# Patient Record
Sex: Male | Born: 1980 | Race: White | Hispanic: No | Marital: Single | Smoking: Current every day smoker
Health system: Southern US, Community
[De-identification: ages and names within clinical notes are randomized; demographics above are authoritative.]

## PROBLEM LIST (undated history)

## (undated) DIAGNOSIS — F1910 Other psychoactive substance abuse, uncomplicated: Secondary | ICD-10-CM

## (undated) HISTORY — PX: FRACTURE SURGERY: SHX138

---

## 2004-05-19 ENCOUNTER — Emergency Department (HOSPITAL_COMMUNITY): Admission: EM | Admit: 2004-05-19 | Discharge: 2004-05-19 | Payer: Self-pay | Admitting: *Deleted

## 2004-07-07 ENCOUNTER — Emergency Department (HOSPITAL_COMMUNITY): Admission: EM | Admit: 2004-07-07 | Discharge: 2004-07-07 | Payer: Self-pay | Admitting: Emergency Medicine

## 2005-12-05 ENCOUNTER — Emergency Department: Payer: Self-pay | Admitting: Emergency Medicine

## 2006-03-30 ENCOUNTER — Emergency Department: Payer: Self-pay | Admitting: Emergency Medicine

## 2009-04-18 ENCOUNTER — Emergency Department: Payer: Self-pay | Admitting: Emergency Medicine

## 2009-06-20 ENCOUNTER — Emergency Department (HOSPITAL_COMMUNITY): Admission: EM | Admit: 2009-06-20 | Discharge: 2009-06-20 | Payer: Self-pay | Admitting: Emergency Medicine

## 2010-04-14 ENCOUNTER — Emergency Department: Payer: Self-pay | Admitting: Emergency Medicine

## 2010-04-19 ENCOUNTER — Emergency Department: Payer: Self-pay | Admitting: Emergency Medicine

## 2011-02-08 LAB — URINALYSIS, ROUTINE W REFLEX MICROSCOPIC
Glucose, UA: NEGATIVE mg/dL
Hgb urine dipstick: NEGATIVE
Ketones, ur: NEGATIVE mg/dL
Protein, ur: NEGATIVE mg/dL
pH: 8 (ref 5.0–8.0)

## 2011-02-08 LAB — COMPREHENSIVE METABOLIC PANEL
ALT: 14 U/L (ref 0–53)
AST: 29 U/L (ref 0–37)
BUN: 11 mg/dL (ref 6–23)
CO2: 25 mEq/L (ref 19–32)
Calcium: 9.4 mg/dL (ref 8.4–10.5)
GFR calc Af Amer: >60 mL/min (ref >60)
Glucose, Bld: 101 mg/dL — ABNORMAL HIGH (ref 70–99)
Total Bilirubin: 1.8 mg/dL — ABNORMAL HIGH (ref 0.3–1.2)
Total Protein: 7 g/dL (ref 6.0–8.3)

## 2011-02-08 LAB — CBC
MCV: 96.1 fL (ref 78.0–100.0)
Platelets: 197 10*3/uL (ref 150–400)
RBC: 5.33 MIL/uL (ref 4.22–5.81)
RDW: 13.6 % (ref 11.5–15.5)

## 2011-02-08 LAB — DIFFERENTIAL
Eosinophils Absolute: 0.1 10*3/uL (ref 0.0–0.7)
Lymphs Abs: 1.2 10*3/uL (ref 0.7–4.0)
Monocytes Relative: 8 % (ref 3–12)
Neutro Abs: 5.2 10*3/uL (ref 1.7–7.7)

## 2011-02-08 LAB — HEMOCCULT GUIAC POC 1CARD (OFFICE): Fecal Occult Bld: NEGATIVE

## 2011-02-08 LAB — LIPASE, BLOOD: Lipase: 29 U/L (ref 11–59)

## 2012-10-18 DIAGNOSIS — F1021 Alcohol dependence, in remission: Secondary | ICD-10-CM | POA: Insufficient documentation

## 2012-10-18 DIAGNOSIS — F1910 Other psychoactive substance abuse, uncomplicated: Secondary | ICD-10-CM | POA: Insufficient documentation

## 2012-10-18 DIAGNOSIS — F17200 Nicotine dependence, unspecified, uncomplicated: Secondary | ICD-10-CM | POA: Insufficient documentation

## 2012-10-18 DIAGNOSIS — R112 Nausea with vomiting, unspecified: Secondary | ICD-10-CM | POA: Insufficient documentation

## 2012-10-19 ENCOUNTER — Emergency Department (HOSPITAL_COMMUNITY)
Admission: EM | Admit: 2012-10-19 | Discharge: 2012-10-19 | Disposition: A | Discharge status: Home / Self Care | Payer: Self-pay | Attending: Emergency Medicine | Admitting: Emergency Medicine

## 2012-10-19 ENCOUNTER — Encounter (HOSPITAL_COMMUNITY): Payer: Self-pay | Admitting: Emergency Medicine

## 2012-10-19 DIAGNOSIS — F1910 Other psychoactive substance abuse, uncomplicated: Secondary | ICD-10-CM

## 2012-10-19 LAB — CBC
HCT: 49.6 % (ref 39.0–52.0)
MCHC: 36.1 g/dL — ABNORMAL HIGH (ref 30.0–36.0)
Platelets: 247 10*3/uL (ref 150–400)
RBC: 5.39 MIL/uL (ref 4.22–5.81)
RDW: 12.9 % (ref 11.5–15.5)
WBC: 12.4 10*3/uL — ABNORMAL HIGH (ref 4.0–10.5)

## 2012-10-19 LAB — RAPID URINE DRUG SCREEN, HOSP PERFORMED
Barbiturates: NOT DETECTED
Benzodiazepines: NOT DETECTED
Cocaine: NOT DETECTED

## 2012-10-19 LAB — COMPREHENSIVE METABOLIC PANEL
AST: 22 U/L (ref 0–37)
Albumin: 3.8 g/dL (ref 3.5–5.2)
BUN: 5 mg/dL — ABNORMAL LOW (ref 6–23)
CO2: 26 mEq/L (ref 19–32)
Creatinine, Ser: 0.67 mg/dL (ref 0.50–1.35)
Glucose, Bld: 89 mg/dL (ref 70–99)
Sodium: 135 mEq/L (ref 135–145)

## 2012-10-19 LAB — ETHANOL: Alcohol, Ethyl (B): 301 mg/dL — ABNORMAL HIGH (ref 0–11)

## 2012-10-19 LAB — SALICYLATE LEVEL: Salicylate Lvl: <2 mg/dL — ABNORMAL LOW (ref 2.8–20.0)

## 2012-10-19 MED ORDER — NICOTINE 21 MG/24HR TD PT24
21.0000 mg | MEDICATED_PATCH | Freq: Every day | TRANSDERMAL | Status: DC
  Filled 2012-10-19: qty 1

## 2012-10-19 MED ORDER — THIAMINE HCL 100 MG/ML IJ SOLN: 100.0000 mg | Freq: Every day | INTRAMUSCULAR | Status: DC

## 2012-10-19 MED ORDER — ONDANSETRON 4 MG PO TBDP: 4.0000 mg | ORAL_TABLET | Freq: Four times a day (QID) | ORAL | Status: DC | PRN

## 2012-10-19 MED ORDER — METHOCARBAMOL 500 MG PO TABS: 500.0000 mg | ORAL_TABLET | Freq: Three times a day (TID) | ORAL | Status: DC | PRN

## 2012-10-19 MED ORDER — VITAMIN B-1 100 MG PO TABS
100.0000 mg | ORAL_TABLET | Freq: Every day | ORAL | Status: DC
  Administered 2012-10-19: 100 mg via ORAL
  Filled 2012-10-19: qty 1

## 2012-10-19 MED ORDER — NAPROXEN 500 MG PO TABS: 500.0000 mg | ORAL_TABLET | Freq: Two times a day (BID) | ORAL | Status: DC | PRN

## 2012-10-19 MED ORDER — HYDROXYZINE HCL 25 MG PO TABS: 25.0000 mg | ORAL_TABLET | Freq: Four times a day (QID) | ORAL | Status: DC | PRN

## 2012-10-19 MED ORDER — LORAZEPAM 1 MG PO TABS: 1.0000 mg | ORAL_TABLET | Freq: Four times a day (QID) | ORAL | Status: DC | PRN

## 2012-10-19 MED ORDER — ADULT MULTIVITAMIN W/MINERALS CH
1.0000 | ORAL_TABLET | Freq: Every day | ORAL | Status: DC
  Administered 2012-10-19: 1 via ORAL
  Filled 2012-10-19: qty 1

## 2012-10-19 MED ORDER — ONDANSETRON 8 MG PO TBDP
8.0000 mg | ORAL_TABLET | Freq: Once | ORAL | Status: AC
Start: 2012-10-19 — End: 2012-10-19
  Administered 2012-10-19: 8 mg via ORAL
  Filled 2012-10-19 (×2): qty 1

## 2012-10-19 MED ORDER — FOLIC ACID 1 MG PO TABS
1.0000 mg | ORAL_TABLET | Freq: Every day | ORAL | Status: DC
  Administered 2012-10-19: 1 mg via ORAL
  Filled 2012-10-19: qty 1

## 2012-10-19 MED ORDER — CLONIDINE HCL 0.2 MG PO TABS: 0.1000 mg | ORAL_TABLET | Freq: Two times a day (BID) | ORAL | Status: DC

## 2012-10-19 MED ORDER — POTASSIUM CHLORIDE 20 MEQ/15ML (10%) PO LIQD
10.0000 meq | Freq: Once | ORAL | Status: AC
  Administered 2012-10-19: 10 meq via ORAL
  Filled 2012-10-19: qty 15

## 2012-10-19 MED ORDER — LORAZEPAM 2 MG/ML IJ SOLN: 1.0000 mg | Freq: Four times a day (QID) | INTRAMUSCULAR | Status: DC | PRN

## 2012-10-19 MED ORDER — ZOLPIDEM TARTRATE 5 MG PO TABS: 5.0000 mg | ORAL_TABLET | Freq: Every evening | ORAL | Status: DC | PRN

## 2012-10-19 MED ORDER — LOPERAMIDE HCL 2 MG PO CAPS: 2.0000 mg | ORAL_CAPSULE | ORAL | Status: DC | PRN

## 2012-10-19 MED ORDER — ALUM & MAG HYDROXIDE-SIMETH 200-200-20 MG/5ML PO SUSP: 30.0000 mL | ORAL | Status: DC | PRN

## 2012-10-19 MED ORDER — DICYCLOMINE HCL 20 MG PO TABS: 20.0000 mg | ORAL_TABLET | Freq: Four times a day (QID) | ORAL | Status: DC | PRN

## 2012-10-19 MED ORDER — ONDANSETRON HCL 4 MG PO TABS: 4.0000 mg | ORAL_TABLET | Freq: Three times a day (TID) | ORAL | Status: DC | PRN

## 2012-10-19 MED ORDER — ACETAMINOPHEN 325 MG PO TABS: 650.0000 mg | ORAL_TABLET | ORAL | Status: DC | PRN

## 2012-10-19 MED ORDER — IBUPROFEN 600 MG PO TABS: 600.0000 mg | ORAL_TABLET | Freq: Three times a day (TID) | ORAL | Status: DC | PRN

## 2012-10-19 NOTE — ED Provider Notes (Addendum)
History     CSN: [id number]  Arrival date & time 10/18/12  2357   First MD Initiated Contact with Patient 10/19/12 0044      Chief Complaint  Patient presents with  . Addiction Problem    (Consider location/radiation/quality/duration/timing/severity/associated sxs/prior treatment) HPI History provided by patient and his father bedside. Patient has history of alcohol abuse and polysubstance abuse. Most recently has been on a drinking binge and using heroin. He presents here requesting detox. Last drink alcohol use prior to arrival. Last used heroin yesterday. He denies any other recent drug abuse. No suicidal or homicidal ideation. No self injury or trauma. Tonight has nausea vomiting. Symptoms moderate in severity History reviewed. No pertinent past medical history.  History reviewed. No pertinent past surgical history.  No family history on file.  History  Substance Use Topics  . Smoking status: Current Every Day Smoker -- 1.0 packs/day    Types: Cigarettes  . Smokeless tobacco: Not on file  . Alcohol Use: Yes      Review of Systems  Constitutional: Negative for fever and chills.  HENT: Negative for neck pain and neck stiffness.   Eyes: Negative for pain.  Respiratory: Negative for shortness of breath.   Cardiovascular: Negative for chest pain.  Gastrointestinal: Positive for nausea and vomiting. Negative for abdominal pain.  Genitourinary: Negative for dysuria.  Musculoskeletal: Negative for back pain.  Skin: Negative for rash.  Neurological: Negative for headaches.  All other systems reviewed and are negative.    Allergies  Review of patient's allergies indicates no known allergies.  Home Medications  No current outpatient prescriptions on file.  BP 141/112  Pulse 100  Temp 97.9 F (36.6 C) (Oral)  Resp 20  SpO2 100%  Physical Exam  Constitutional: He is oriented to person, place, and time. He appears well-developed and well-nourished.  HENT:  Head:  Normocephalic and atraumatic.  Eyes: Conjunctivae normal and EOM are normal. Pupils are equal, round, and reactive to light.  Neck: Trachea normal. Neck supple. No thyromegaly present.  Cardiovascular: Normal rate, regular rhythm, S1 normal, S2 normal and normal pulses.     No systolic murmur is present   No diastolic murmur is present  Pulses:      Radial pulses are 2+ on the right side, and 2+ on the left side.  Pulmonary/Chest: Effort normal and breath sounds normal. He has no wheezes. He has no rhonchi. He has no rales. He exhibits no tenderness.  Abdominal: Soft. Normal appearance and bowel sounds are normal. There is no tenderness. There is no CVA tenderness and negative Murphy's sign.  Musculoskeletal: He exhibits no edema and no tenderness.  Neurological: He is alert and oriented to person, place, and time. He has normal strength. No cranial nerve deficit or sensory deficit. GCS eye subscore is 4. GCS verbal subscore is 5. GCS motor subscore is 6.  Skin: Skin is warm and dry. No rash noted. He is not diaphoretic.  Psychiatric: His speech is normal.       Cooperative and appropriate    ED Course  Procedures (including critical care time)  Results for orders placed during the hospital encounter of 10/19/12  ACETAMINOPHEN LEVEL      Component Value Range   Acetaminophen (Tylenol), Serum <15.0  10 - 30 ug/mL  CBC      Component Value Range   WBC 12.4 (*) 4.0 - 10.5 K/uL   RBC 5.39  4.22 - 5.81 MIL/uL   Hemoglobin 17.9 (*) 13.0 -  17.0 g/dL   HCT 16.1  09.6 - 04.5 %   MCV 92.0  78.0 - 100.0 fL   MCH 33.2  26.0 - 34.0 pg   MCHC 36.1 (*) 30.0 - 36.0 g/dL   RDW 40.9  81.1 - 91.4 %   Platelets 247  150 - 400 K/uL  COMPREHENSIVE METABOLIC PANEL      Component Value Range   Sodium 135  135 - 145 mEq/L   Potassium 3.4 (*) 3.5 - 5.1 mEq/L   Chloride 96  96 - 112 mEq/L   CO2 26  19 - 32 mEq/L   Glucose, Bld 89  70 - 99 mg/dL   BUN 5 (*) 6 - 23 mg/dL   Creatinine, Ser 7.82  0.50 -  1.35 mg/dL   Calcium 9.1  8.4 - 95.6 mg/dL   Total Protein 7.8  6.0 - 8.3 g/dL   Albumin 3.8  3.5 - 5.2 g/dL   AST 22  0 - 37 U/L   ALT 16  0 - 53 U/L   Alkaline Phosphatase 69  39 - 117 U/L   Total Bilirubin 0.4  0.3 - 1.2 mg/dL   GFR calc non Af Amer >90  >90 mL/min   GFR calc Af Amer >90  >90 mL/min  ETHANOL      Component Value Range   Alcohol, Ethyl (B) 301 (*) 0 - 11 mg/dL  SALICYLATE LEVEL      Component Value Range   Salicylate Lvl <2.0 (*) 2.8 - 20.0 mg/dL  URINE RAPID DRUG SCREEN (HOSP PERFORMED)      Component Value Range   Opiates NONE DETECTED  NONE DETECTED   Cocaine NONE DETECTED  NONE DETECTED   Benzodiazepines NONE DETECTED  NONE DETECTED   Amphetamines NONE DETECTED  NONE DETECTED   Tetrahydrocannabinol POSITIVE (*) NONE DETECTED   Barbiturates NONE DETECTED  NONE DETECTED    1:09 AM ACT consulted  Alcohol and clonidine protocol initiated  MDM   Polysubstance abuse, requesting detox. Labs and UA reviewed as above. ACT involved to attempt placement. No indication for IVC.         Sunnie Nielsen, MD 10/19/12 0123  Patient moved to the psych ED pending possible detox 6:45 AM now stating he no longer wants detox wants to leave. Outpatient referrals provided. No SI or HI  Sunnie Nielsen, MD 10/19/12 [phone]

## 2012-10-19 NOTE — ED Notes (Signed)
Pt's dad will take pts belongings home

## 2012-10-19 NOTE — ED Notes (Signed)
Pt. Attempted to call his father for transportation home.  Father not asnwering his phone, pt. Upset because he wants to go home and can't find a ride.  Introduced myself to pt and explained that he can attempt to reach father again and try to rest until then.  Pt. Walked back to his room.

## 2012-10-19 NOTE — BH Assessment (Signed)
Assessment Note   Perry Kane is a 31 y.o. male who presents for detox. Pt denies SI/HI/Psych.  Pt reports the following: Pt is using alcohol(6pk up to 1/5 daily, last use was 10/18/12), heroin(4-5 bags daily, last use 10/18/12).  Pt also uses pain pills, Opana when heroin not avail, last use was 10/18/12.  Pt says he used 20mg  of Opana. 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  Pt admits to shooting in his right arm.  Pt told this Clinical research associate that he has been drinking heavily x1 month because of issues with relationships and financial problems.  Pt says--"I'm a fucking alcoholic and I'm stupid".  Pt says he's tired of living this way.  During assessment, pt was mostly uncooperative and unwilling to answer questions.  Pt.'s father was at bedside and encouraged him to be honest in order to receive help with SA.  Pt later requested d/c'd, decided against detox. Per dr. Dierdre Highman, doesn't want the pt to leave b/c stayed at bedside all night, wanted nurse to contact father to see if he was going to return.  Per dr. Dierdre Highman if father returns then pt can't leave, if father decides not to come back to hospital then pt can leave.   Axis I: Polysub Dep  Axis II: Deferred Axis III: History reviewed. No pertinent past medical history. Axis IV: economic problems, other psychosocial or environmental problems, problems related to social environment and problems with primary support group Axis V: 51-60 moderate symptoms  Past Medical History: History reviewed. No pertinent past medical history.  History reviewed. No pertinent past surgical history.  Family History: No family history on file.  Social History:  reports that he has been smoking Cigarettes.  He has been smoking about 1 pack per day. He does not have any smokeless tobacco history on file. He reports that he drinks alcohol. He reports that he uses illicit drugs (Heroin, Marijuana, and Opium).  Additional Social  History:  Alcohol / Drug Use Pain Medications: None  Prescriptions: None  Over the Counter: None  History of alcohol / drug use?: Yes Longest period of sobriety (when/how long): None  Negative Consequences of Use: Personal relationships;Work / Programmer, multimedia Withdrawal Symptoms: Other (Comment) (No current w/d sxs) Substance #1 Name of Substance 1: Alcohol 1 - Age of First Use: 10 YOM  1 - Amount (size/oz): 6pk up to 1/5  1 - Frequency: Daily  1 - Duration: On-going  1 - Last Use / Amount: 10/18/12 Substance #2 Name of Substance 2: Heroin--Sometimes uses Pain Pills(used Opana 10/18/12, 20mg ) 2 - Age of First Use: 31 YOM  2 - Amount (size/oz): 4-5 Bags  2 - Frequency: Daily  2 - Duration: On-going  2 - Last Use / Amount: 10/18/12 Substance #3 Name of Substance 3: THC  3 - Age of First Use: Teens  3 - Amount (size/oz): Unk  3 - Frequency: Unk  3 - Duration: Unk  3 - Last Use / Amount: 10/18/12  CIWA: CIWA-Ar BP: 137/92 mmHg Pulse Rate: 83  Nausea and Vomiting: no nausea and no vomiting Tactile Disturbances: none Tremor: no tremor Auditory Disturbances: not present Paroxysmal Sweats: no sweat visible Visual Disturbances: not present Anxiety: no anxiety, at ease Headache, Fullness in Head: none present Agitation: normal activity Orientation and Clouding of Sensorium: oriented and can do serial additions CIWA-Ar Total: 0  COWS: Clinical Opiate Withdrawal Scale (COWS) Resting Pulse Rate: Pulse Rate 81-100 Sweating: No  report of chills or flushing Restlessness: Able to sit still Pupil Size: Pupils pinned or normal size for room light Bone or Joint Aches: Not present Runny Nose or Tearing: Not present GI Upset: No GI symptoms Tremor: No tremor Yawning: No yawning Anxiety or Irritability: None Gooseflesh Skin: Skin is smooth COWS Total Score: 1   Allergies: No Known Allergies  Home Medications:  (Not in a hospital admission)  OB/GYN Status:  No LMP for male  patient.  General Assessment Data Location of Assessment: [facility] Living Arrangements: Alone Can pt return to current living arrangement?: Yes Admission Status: Voluntary Is patient capable of signing voluntary admission?: Yes Transfer from: Acute Hospital Referral Source: MD  Education Status Is patient currently in school?: No Current Grade: None  Highest grade of school patient has completed: None  Name of school: None  Contact person: None   Risk to self Suicidal Ideation: No Suicidal Intent: No Is patient at risk for suicide?: No Suicidal Plan?: No Access to Means: No What has been your use of drugs/alcohol within the last 12 months?: Abusing: Alcohol, Heroin, Pain Pills  Previous Attempts/Gestures: No How many times?: 0  Other Self Harm Risks: None  Triggers for Past Attempts: None known Intentional Self Injurious Behavior: None Family Suicide History: No Recent stressful life event(s): Financial Problems;Other (Comment) (Relational Issues ) Persecutory voices/beliefs?: No Depression: No Depression Symptoms:  (None Reported) Substance abuse history and/or treatment for substance abuse?: Yes Suicide prevention information given to non-admitted patients: Not applicable  Risk to Others Homicidal Ideation: No Thoughts of Harm to Others: No Current Homicidal Intent: No Current Homicidal Plan: No Access to Homicidal Means: No Identified Victim: None  History of harm to others?: No Assessment of Violence: None Noted Violent Behavior Description: None  Does patient have access to weapons?: No Criminal Charges Pending?: No Does patient have a court date: No  Psychosis Hallucinations: None noted Delusions: None noted  Mental Status Report Appear/Hygiene: Disheveled Eye Contact: Poor Motor Activity: Unremarkable Speech: Slurred;Logical/coherent Level of Consciousness: Drowsy Mood: Ambivalent Affect: Irritable Anxiety Level: None Thought Processes:  Coherent;Relevant Judgement: Unimpaired Orientation: Person;Place;Time;Situation Obsessive Compulsive Thoughts/Behaviors: None  Cognitive Functioning Concentration: Normal Memory: Recent Intact;Remote Intact IQ: Average Insight: Fair Impulse Control: Fair Appetite: Fair Weight Loss: 0  Weight Gain: 0  Sleep: No Change Total Hours of Sleep: 8  Vegetative Symptoms: None  ADLScreening [facility] Assessment Services) Patient's cognitive ability adequate to safely complete daily activities?: Yes Patient able to express need for assistance with ADLs?: Yes Independently performs ADLs?: Yes (appropriate for developmental age)  Abuse/Neglect [facility]) Physical Abuse: Denies Verbal Abuse: Denies Sexual Abuse: Denies  Prior Inpatient Therapy Prior Inpatient Therapy: No Prior Therapy Dates: None  Prior Therapy Facilty/Provider(s): None  Reason for Treatment: None   Prior Outpatient Therapy Prior Outpatient Therapy: No Prior Therapy Dates:  (None ) Prior Therapy Facilty/Provider(s): None  Reason for Treatment:  (None )  ADL Screening (condition at time of admission) Patient's cognitive ability adequate to safely complete daily activities?: Yes Patient able to express need for assistance with ADLs?: Yes Independently performs ADLs?: Yes (appropriate for developmental age) Weakness of Legs: None Weakness of Arms/Hands: None  Home Assistive Devices/Equipment Home Assistive Devices/Equipment: None  Therapy Consults (therapy consults require a physician order) PT Evaluation Needed: No OT Evalulation Needed: No SLP Evaluation Needed: No Abuse/Neglect Assessment (Assessment to be complete while patient is alone) Physical Abuse: Denies Verbal Abuse: Denies Sexual Abuse: Denies Exploitation of patient/patient's resources: Denies Self-Neglect: Denies Values /  Beliefs Cultural Requests During Hospitalization: None Spiritual Requests During Hospitalization: None Consults Spiritual Care  Consult Needed: No Social Work Consult Needed: No Merchant navy officer (For Healthcare) Advance Directive: Patient does not have advance directive;Patient would not like information Pre-existing out of facility DNR order (yellow form or pink MOST form): No Nutrition Screen- [facility] Patient's home diet: Regular Have you recently lost weight without trying?: No Have you been eating poorly because of a decreased appetite?: No Malnutrition Screening Tool Score: 0         Disposition:     On Site Evaluation by:   Reviewed with Physician:     Murrell Redden 10/19/2012 6:30 AM

## 2012-10-19 NOTE — ED Notes (Signed)
Pt sts he wants detox from ETOH and heroine. Pt appears unsteady and intoxicated.pt denies SI ans HI.

## 2013-05-18 ENCOUNTER — Emergency Department: Payer: Self-pay | Admitting: Internal Medicine

## 2020-04-05 ENCOUNTER — Emergency Department
Admission: EM | Admit: 2020-04-05 | Discharge: 2020-04-05 | Disposition: A | Discharge status: Home / Self Care | Payer: Self-pay | Attending: Student in an Organized Health Care Education/Training Program | Admitting: Student in an Organized Health Care Education/Training Program

## 2020-04-05 ENCOUNTER — Other Ambulatory Visit: Payer: Self-pay

## 2020-04-05 DIAGNOSIS — Z91040 Latex allergy status: Secondary | ICD-10-CM | POA: Insufficient documentation

## 2020-04-05 DIAGNOSIS — Y9389 Activity, other specified: Secondary | ICD-10-CM | POA: Insufficient documentation

## 2020-04-05 DIAGNOSIS — T189XXA Foreign body of alimentary tract, part unspecified, initial encounter: Secondary | ICD-10-CM | POA: Insufficient documentation

## 2020-04-05 DIAGNOSIS — F1110 Opioid abuse, uncomplicated: Secondary | ICD-10-CM | POA: Insufficient documentation

## 2020-04-05 DIAGNOSIS — F1210 Cannabis abuse, uncomplicated: Secondary | ICD-10-CM | POA: Insufficient documentation

## 2020-04-05 DIAGNOSIS — X58XXXA Exposure to other specified factors, initial encounter: Secondary | ICD-10-CM | POA: Insufficient documentation

## 2020-04-05 DIAGNOSIS — Y929 Unspecified place or not applicable: Secondary | ICD-10-CM | POA: Insufficient documentation

## 2020-04-05 DIAGNOSIS — Y998 Other external cause status: Secondary | ICD-10-CM | POA: Insufficient documentation

## 2020-04-05 DIAGNOSIS — F17210 Nicotine dependence, cigarettes, uncomplicated: Secondary | ICD-10-CM | POA: Insufficient documentation

## 2020-04-05 NOTE — ED Provider Notes (Signed)
[facility] Emergency Department Provider Note    First MD Initiated Contact with Patient 04/05/20 1202     (approximate)  I have reviewed the triage vital signs and the nursing notes.   HISTORY  Chief Complaint Ingestion    HPI Perry Kane is a 39 y.o. male presents to the ER please custody due to reported ingesting a bag of unreported substance.  Patient denies any symptoms.  Does admit to using heroin for the past 24 hours.  Denies any nausea vomiting chest pain or shortness of breath.  Has not required any Narcan.  With officer outside of the room patient does admit to ingesting "small baggy with only a tiny bit of residue left."  This is not an intent to harm himself.  This occurred around 11:00.   History reviewed. No pertinent past medical history. No family history on file. History reviewed. No pertinent surgical history. There are no problems to display for this patient.     Prior to Admission medications   Medication Sig Start Date End Date Taking? Authorizing Provider  acetaminophen (TYLENOL) 500 MG tablet Take 1,000 mg by mouth every 6 (six) hours as needed. For pain or headache   Yes [provider]    Allergies Latex and Nickel    Social History Social History   Tobacco Use  . Smoking status: Current Every Day Smoker    Packs/day: 1.00    Types: Cigarettes  . Smokeless tobacco: Never Used  Substance Use Topics  . Alcohol use: Yes    Comment: 6pk-1/5 daily   . Drug use: Yes    Types: Heroin, Marijuana, Opium    Comment: heroine    Review of Systems Patient denies headaches, rhinorrhea, blurry vision, numbness, shortness of breath, chest pain, edema, cough, abdominal pain, nausea, vomiting, diarrhea, dysuria, fevers, rashes or hallucinations unless otherwise stated above in HPI. ____________________________________________   PHYSICAL EXAM:  VITAL SIGNS: Vitals:   04/05/20 1139 04/05/20 1549  BP: 131/89 (!)  131/97  Pulse: (!) 120 100  Resp: 17 16  Temp: 98.9 F (37.2 C) 98.5 F (36.9 C)  SpO2: 99% 99%    Constitutional: Alert and oriented.  Eyes: Conjunctivae are normal.  Head: Atraumatic. Nose: No congestion/rhinnorhea. Mouth/Throat: Mucous membranes are moist.   Neck: No stridor. Painless ROM.  Cardiovascular: Normal rate, regular rhythm. Grossly normal heart sounds.  Good peripheral circulation. Respiratory: Normal respiratory effort.  No retractions. Lungs CTAB. Gastrointestinal: Soft and nontender. No distention. No abdominal bruits. No CVA tenderness. Genitourinary:  Musculoskeletal: No lower extremity tenderness nor edema.  No joint effusions. Neurologic:  Normal speech and language. No gross focal neurologic deficits are appreciated. No facial droop Skin:  Skin is warm, dry and intact. No rash noted. Psychiatric: Mood and affect are normal. Speech and behavior are normal.  ____________________________________________   LABS (all labs ordered are listed, but only abnormal results are displayed)  No results found for this or any previous visit (from the past 24 hour(s)). ____________________________________________  EKG My review and personal interpretation at Time: 12:41   Indication: ingestion  Rate: 100  Rhythm: sinus Axis: normal Other: normal intervals, no stemi ____________________________________________  RADIOLOGY  I personally reviewed all radiographic images ordered to evaluate for the above acute complaints and reviewed radiology reports and findings.  These findings were personally discussed with the patient.  Please see medical record for radiology report.  ____________________________________________   PROCEDURES  Procedure(s) performed:  Procedures    Critical  Care performed: no ____________________________________________   INITIAL IMPRESSION / ASSESSMENT AND PLAN / ED COURSE  Pertinent labs & imaging results that were available during my  care of the patient were reviewed by me and considered in my medical decision making (see chart for details).   DDX: Overdose, toxic ingestion, foreign body ingestion  Perry Kane is a 39 y.o. who presents to the ED with symptoms as described above.  Patient well-appearing afebrile hemodynamically stable nontoxic.  He is declining any medical work-up does agree to EKG which does not show any evidence of dysrhythmia or prolonged intervals.  Discussed case with poison control who recommends observation for 6hours.  This seems like a reasonable plan.  He remained under police custody.  No indication for IVC at this time.  Clinical Course as of Apr 06 1739  Thu Apr 05, 2020  1737 Patient has been observed for 6 hours post ingestion remains clinically stable in no acute distress.  He is medically cleared for discharge to police custody   [PR]    Clinical Course User Index [PR] Merlyn Lot, MD    The patient was evaluated in Emergency Department today for the symptoms described in the history of present illness. He/she was evaluated in the context of the global COVID-19 pandemic, which necessitated consideration that the patient might be at risk for infection with the SARS-CoV-2 virus that causes COVID-19. Institutional protocols and algorithms that pertain to the evaluation of patients at risk for COVID-19 are in a state of rapid change based on information released by regulatory bodies including the CDC and federal and state organizations. These policies and algorithms were followed during the patient's care in the ED.  As part of my medical decision making, I reviewed the following data within the [place] notes reviewed and incorporated, Labs reviewed, notes from prior ED visits and [place] Controlled Substance Database   ____________________________________________   FINAL CLINICAL IMPRESSION(S) / ED DIAGNOSES  Final diagnoses:  Ingestion of foreign material,  initial encounter      NEW MEDICATIONS STARTED DURING THIS VISIT:  New Prescriptions   No medications on file     Note:  This document was prepared using Dragon voice recognition software and may include unintentional dictation errors.    Merlyn Lot, MD 04/05/20 1740

## 2020-04-05 NOTE — ED Notes (Signed)
Pt in via police custody. Pt states that he was out looking for a job and got pulled over. Pt states that the officer said he saw him put something in his mouth and swallow it but pt states he did not. Officer concerned it could be meth or heroin. Advised pt that this could be life threatening and offered to have officer step out of the room but pt states that he did not swallow anything. Pt denies concerns or symptoms.

## 2020-04-05 NOTE — ED Triage Notes (Signed)
Pt comes into the ED via Counsellor in custody, states witnessed the pt ingestion a "baggie" of unknown substance about PTA. Pt will not tell what he ingested.

## 2020-05-02 ENCOUNTER — Other Ambulatory Visit: Payer: Self-pay

## 2020-05-02 ENCOUNTER — Encounter (HOSPITAL_COMMUNITY): Payer: Self-pay

## 2020-05-02 DIAGNOSIS — F17210 Nicotine dependence, cigarettes, uncomplicated: Secondary | ICD-10-CM | POA: Insufficient documentation

## 2020-05-02 DIAGNOSIS — F1910 Other psychoactive substance abuse, uncomplicated: Secondary | ICD-10-CM | POA: Insufficient documentation

## 2020-05-02 DIAGNOSIS — M79662 Pain in left lower leg: Secondary | ICD-10-CM | POA: Insufficient documentation

## 2020-05-02 DIAGNOSIS — Z91040 Latex allergy status: Secondary | ICD-10-CM | POA: Insufficient documentation

## 2020-05-02 NOTE — ED Triage Notes (Signed)
Patient arrived stating his left calf started hurting about 4 days ago. Declines any falls or injury at this time. Patient ambulatory in triage. Reports taking tylenol with no relief.

## 2020-05-03 ENCOUNTER — Encounter (HOSPITAL_COMMUNITY): Payer: Self-pay

## 2020-05-03 ENCOUNTER — Emergency Department (HOSPITAL_COMMUNITY): Payer: Self-pay

## 2020-05-03 ENCOUNTER — Other Ambulatory Visit: Payer: Self-pay

## 2020-05-03 ENCOUNTER — Emergency Department (HOSPITAL_COMMUNITY)
Admission: EM | Admit: 2020-05-03 | Discharge: 2020-05-03 | Disposition: A | Discharge status: Home / Self Care | Payer: Self-pay | Attending: Emergency Medicine | Admitting: Emergency Medicine

## 2020-05-03 ENCOUNTER — Ambulatory Visit (HOSPITAL_COMMUNITY)
Admission: RE | Admit: 2020-05-03 | Discharge: 2020-05-03 | Disposition: A | Discharge status: Home / Self Care | Payer: Self-pay | Source: Ambulatory Visit | Attending: Emergency Medicine | Admitting: Emergency Medicine

## 2020-05-03 DIAGNOSIS — M79662 Pain in left lower leg: Secondary | ICD-10-CM | POA: Insufficient documentation

## 2020-05-03 DIAGNOSIS — Z7901 Long term (current) use of anticoagulants: Secondary | ICD-10-CM | POA: Insufficient documentation

## 2020-05-03 DIAGNOSIS — I82402 Acute embolism and thrombosis of unspecified deep veins of left lower extremity: Secondary | ICD-10-CM | POA: Insufficient documentation

## 2020-05-03 DIAGNOSIS — M79609 Pain in unspecified limb: Secondary | ICD-10-CM

## 2020-05-03 DIAGNOSIS — F17210 Nicotine dependence, cigarettes, uncomplicated: Secondary | ICD-10-CM | POA: Insufficient documentation

## 2020-05-03 DIAGNOSIS — Z91040 Latex allergy status: Secondary | ICD-10-CM | POA: Insufficient documentation

## 2020-05-03 LAB — BASIC METABOLIC PANEL
Anion gap: 8 (ref 5–15)
BUN: 17 mg/dL (ref 6–20)
CO2: 27 mmol/L (ref 22–32)
Calcium: 9 mg/dL (ref 8.9–10.3)
Chloride: 103 mmol/L (ref 98–111)
Creatinine, Ser: 0.81 mg/dL (ref 0.61–1.24)
GFR calc Af Amer: >60 mL/min (ref >60)
GFR calc non Af Amer: >60 mL/min (ref >60)
Glucose, Bld: 91 mg/dL (ref 70–99)
Potassium: 4.4 mmol/L (ref 3.5–5.1)
Sodium: 138 mmol/L (ref 135–145)

## 2020-05-03 LAB — CBC WITH DIFFERENTIAL/PLATELET
Abs Immature Granulocytes: 0.03 10*3/uL (ref 0.00–0.07)
Basophils Absolute: 0 10*3/uL (ref 0.0–0.1)
Basophils Relative: 0 %
Eosinophils Absolute: 0.1 10*3/uL (ref 0.0–0.5)
Eosinophils Relative: 1 %
HCT: 43.6 % (ref 39.0–52.0)
Hemoglobin: 14.5 g/dL (ref 13.0–17.0)
Immature Granulocytes: 0 %
Lymphocytes Relative: 14 %
Lymphs Abs: 1.4 10*3/uL (ref 0.7–4.0)
MCH: 31.3 pg (ref 26.0–34.0)
MCHC: 33.3 g/dL (ref 30.0–36.0)
MCV: 94 fL (ref 80.0–100.0)
Monocytes Absolute: 1 10*3/uL (ref 0.1–1.0)
Monocytes Relative: 10 %
Neutro Abs: 7.5 10*3/uL (ref 1.7–7.7)
Neutrophils Relative %: 75 %
Platelets: 293 10*3/uL (ref 150–400)
RBC: 4.64 MIL/uL (ref 4.22–5.81)
RDW: 13.2 % (ref 11.5–15.5)
WBC: 10 10*3/uL (ref 4.0–10.5)
nRBC: 0 % (ref 0.0–0.2)

## 2020-05-03 MED ORDER — SODIUM CHLORIDE (PF) 0.9 % IJ SOLN
INTRAMUSCULAR | Status: AC
  Filled 2020-05-03: qty 50

## 2020-05-03 MED ORDER — RIVAROXABAN 15 MG PO TABS
15.0000 mg | ORAL_TABLET | Freq: Once | ORAL | Status: AC
  Administered 2020-05-03: 15 mg via ORAL
  Filled 2020-05-03: qty 1

## 2020-05-03 MED ORDER — RIVAROXABAN (XARELTO) VTE STARTER PACK (15 & 20 MG): ORAL_TABLET | ORAL | 0 refills | Status: AC

## 2020-05-03 MED ORDER — IOHEXOL 350 MG/ML SOLN
100.0000 mL | Freq: Once | INTRAVENOUS | Status: AC | PRN
  Administered 2020-05-03: 100 mL via INTRAVENOUS

## 2020-05-03 MED ORDER — KETOROLAC TROMETHAMINE 30 MG/ML IJ SOLN
30.0000 mg | Freq: Once | INTRAMUSCULAR | Status: AC
  Administered 2020-05-03: 30 mg via INTRAMUSCULAR
  Filled 2020-05-03: qty 1

## 2020-05-03 MED ORDER — ACETAMINOPHEN 325 MG PO TABS: 650.0000 mg | ORAL_TABLET | Freq: Four times a day (QID) | ORAL | 0 refills | Status: AC | PRN

## 2020-05-03 MED ORDER — OXYCODONE-ACETAMINOPHEN 5-325 MG PO TABS
1.0000 | ORAL_TABLET | Freq: Once | ORAL | Status: AC
  Administered 2020-05-03: 1 via ORAL
  Filled 2020-05-03: qty 1

## 2020-05-03 MED ORDER — ENOXAPARIN SODIUM 120 MG/0.8ML ~~LOC~~ SOLN
110.0000 mg | Freq: Once | SUBCUTANEOUS | Status: AC
  Administered 2020-05-03: 110 mg via SUBCUTANEOUS
  Filled 2020-05-03: qty 0.74

## 2020-05-03 NOTE — ED Notes (Signed)
Pt verbalizes understanding of DC instructions. Pt belongings returned and is ambulatory out of ED.  

## 2020-05-03 NOTE — ED Notes (Signed)
Pt ambulatory to RR with one crutch. Sample cup provided.

## 2020-05-03 NOTE — Progress Notes (Signed)
Left lower extremity venous duplex completed. Refer to "CV Proc" under chart review to view preliminary results.  05/03/2020 11:49 AM Eula Fried., MHA, RVT, RDCS, RDMS

## 2020-05-03 NOTE — Discharge Instructions (Addendum)
Thank you for allowing me to care for you today in the Emergency Department.    IMPORTANT PATIENT INSTRUCTIONS:  You have been scheduled for an Outpatient Vascular Study at [facility].    Please go to the [facility] Department Registration Desk at 11 am tomorrow morning and tell them you are there for a vascular study.    Take 650 mg of Tylenol or 600 mg of ibuprofen with food every 6 hours for pain.  You can alternate between these 2 medications every 3 hours if your pain returns.  For instance, you can take Tylenol at noon, followed by a dose of ibuprofen at 3, followed by second dose of Tylenol and 6.  Continue to elevate your left leg so that your toes are at or above the level of your nose, keep the Ace wrap over your calf to provide compression and help with pain, and use the crutches as needed until you can weight-bear on your left foot without considerable pain.  You were given a dose of a blood thinner, Lovenox tonight in the ER since there is concern that your pain may be due to a blood clot in your leg.  You should return to the emergency department if you have a fall or injury before you are seen for your ultrasound in the morning.  You should also return to the emergency department if your leg gets red, very swollen and hard, if your toes turn blue, if you develop new numbness, or other new, concerning symptoms.

## 2020-05-03 NOTE — ED Notes (Addendum)
Pharmacy otw to do Xarelto education

## 2020-05-03 NOTE — ED Provider Notes (Signed)
[facility]-EMERGENCY DEPT Provider Note   CSN: [id number] Arrival date & time: 05/03/20  1154     History Chief Complaint  Patient presents with  . DVT    Perry Kane is a 39 y.o. male.  HPI   39 year old male with history of polysubstance use disorder, who presents to the emergency department today complaining of pain to the left calf. He was seen in the ED last night and ultrasound of the left lower extremity was ordered which showed DVT therefore patient checked into the ED. During his visit last night he had denied any shortness of breath however he is now telling me that he has been short of breath for the last 3 days. He denies any chest pain or pain with inspiration.  History reviewed. No pertinent past medical history.  There are no problems to display for this patient.   Past Surgical History:  Procedure Laterality Date  . FRACTURE SURGERY         Family History  Problem Relation Age of Onset  . Heart attack Mother   . Heart failure Father     Social History   Tobacco Use  . Smoking status: Current Every Day Smoker    Packs/day: 1.00    Types: Cigarettes  . Smokeless tobacco: Never Used  Vaping Use  . Vaping Use: Never used  Substance Use Topics  . Alcohol use: Yes  . Drug use: Yes    Types: Heroin, Marijuana, Opium    Comment: heroine    Home Medications Prior to Admission medications   Medication Sig Start Date End Date Taking? Authorizing Provider  acetaminophen (TYLENOL) 325 MG tablet Take 2 tablets (650 mg total) by mouth every 6 (six) hours as needed. Do not take more than 4000mg  of tylenol per day 05/03/20   Adean Milosevic S, PA-C  RIVAROXABAN (XARELTO) VTE STARTER PACK (15 & 20 MG TABLETS) Follow package directions: Take one 15mg  tablet by mouth twice a day. On day 22, switch to one 20mg  tablet once a day. Take with food. 05/03/20   Autym Siess S, PA-C    Allergies    Latex and Nickel  Review of Systems   Review  of Systems  Constitutional: Negative for fever.  HENT: Negative for ear pain and sore throat.   Eyes: Negative for visual disturbance.  Respiratory: Positive for shortness of breath.   Cardiovascular: Negative for chest pain.  Gastrointestinal: Negative for abdominal pain, constipation, diarrhea, nausea and vomiting.  Genitourinary: Negative for dysuria and hematuria.  Musculoskeletal: Negative for back pain.       Left leg pain  Skin: Negative for rash.  Neurological: Negative for headaches.  All other systems reviewed and are negative.   Physical Exam Updated Vital Signs BP 120/65   Pulse 92   Temp 98.6 F (37 C) (Oral)   Resp 18   Ht 5\' 4"  (1.626 m)   Wt 71.8 kg   SpO2 99%   BMI 27.15 kg/m   Physical Exam Vitals and nursing note reviewed.  Constitutional:      Appearance: He is well-developed.  HENT:     Head: Normocephalic and atraumatic.  Eyes:     Conjunctiva/sclera: Conjunctivae normal.  Cardiovascular:     Rate and Rhythm: Normal rate and regular rhythm.     Heart sounds: No murmur heard.   Pulmonary:     Effort: Pulmonary effort is normal. No respiratory distress.  Abdominal:     General: Bowel sounds  are normal.     Palpations: Abdomen is soft.     Tenderness: There is no abdominal tenderness. There is no guarding or rebound.  Musculoskeletal:        General: Tenderness present.     Cervical back: Neck supple.     Right lower leg: No edema.     Left lower leg: Edema present.  Skin:    General: Skin is warm and dry.  Neurological:     Mental Status: He is alert.     ED Results / Procedures / Treatments   Labs (all labs ordered are listed, but only abnormal results are displayed) Labs Reviewed  CBC WITH DIFFERENTIAL/PLATELET  BASIC METABOLIC PANEL    EKG None  Radiology CT Angio Chest PE W and/or Wo Contrast  Result Date: 05/03/2020 CLINICAL DATA:  DVT LEFT calf, LEFT leg pain, shortness of breath, question pulmonary embolism, high  clinical probability EXAM: CT ANGIOGRAPHY CHEST WITH CONTRAST TECHNIQUE: Multidetector CT imaging of the chest was performed using the standard protocol during bolus administration of intravenous contrast. Multiplanar CT image reconstructions and MIPs were obtained to evaluate the vascular anatomy. CONTRAST:  100mL OMNIPAQUE IOHEXOL 350 MG/ML SOLN IV COMPARISON:  None FINDINGS: Cardiovascular: Aorta normal caliber without aneurysm or dissection. Heart unremarkable. No pericardial effusion. Pulmonary arteries adequately opacified and patent. No evidence of pulmonary embolism. Mediastinum/Nodes: Esophagus normal appearance. No thoracic adenopathy. Base of cervical region unremarkable. Lungs/Pleura: Lungs clear. No pulmonary infiltrate, pleural effusion or pneumothorax. No definite pulmonary mass. Upper Abdomen: Visualized upper abdomen unremarkable Musculoskeletal: No acute osseous findings. Review of the MIP images confirms the above findings. IMPRESSION: Normal exam. No evidence of pulmonary embolism. No acute intrathoracic abnormalities. Electronically Signed   By: Ulyses SouthwardMark  Boles M.D.   On: 05/03/2020 14:36   LE VENOUS  Result Date: 05/03/2020  Lower Venous DVTStudy Indications: Left calf pain x 4 days.  Comparison Study: No prior study Performing Technologist: Gertie FeyMichelle Simonetti MHA, RDMS, RVT, RDCS  Examination Guidelines: A complete evaluation includes B-mode imaging, spectral Doppler, color Doppler, and power Doppler as needed of all accessible portions of each vessel. Bilateral testing is considered an integral part of a complete examination. Limited examinations for reoccurring indications may be performed as noted. The reflux portion of the exam is performed with the patient in reverse Trendelenburg.  +-----+---------------+---------+-----------+----------+--------------+ RIGHTCompressibilityPhasicitySpontaneityPropertiesThrombus Aging  +-----+---------------+---------+-----------+----------+--------------+ CFV  Full           Yes      Yes                                 +-----+---------------+---------+-----------+----------+--------------+   +---------+---------------+---------+-----------+----------+------------------+ LEFT     CompressibilityPhasicitySpontaneityPropertiesThrombus Aging     +---------+---------------+---------+-----------+----------+------------------+ CFV      Full           Yes      Yes                                     +---------+---------------+---------+-----------+----------+------------------+ SFJ      Full                                                            +---------+---------------+---------+-----------+----------+------------------+  FV Prox  Full                                                            +---------+---------------+---------+-----------+----------+------------------+ FV Mid   Full                                                            +---------+---------------+---------+-----------+----------+------------------+ FV DistalFull                                                            +---------+---------------+---------+-----------+----------+------------------+ PFV      Full                                                            +---------+---------------+---------+-----------+----------+------------------+ POP      Full           Yes      Yes                                     +---------+---------------+---------+-----------+----------+------------------+ PTV      None                    No                   Acute- single PTV                                                        only               +---------+---------------+---------+-----------+----------+------------------+ PERO     Full                                                             +---------+---------------+---------+-----------+----------+------------------+     Summary: LEFT: - Findings consistent with acute deep vein thrombosis involving a single left posterior tibial vein. - No cystic structure found in the popliteal fossa.  *See table(s) above for measurements and observations.    Preliminary     Procedures Procedures (including critical care time)  Medications Ordered in ED Medications  Rivaroxaban (XARELTO) tablet 15 mg (has no administration in time range)  iohexol (OMNIPAQUE) 350 MG/ML injection 100 mL (100 mLs Intravenous Contrast Given 05/03/20 1358)  sodium chloride (PF) 0.9 % injection (  Given  by Other 05/03/20 1414)    ED Course  I have reviewed the triage vital signs and the nursing notes.  Pertinent labs & imaging results that were available during my care of the patient were reviewed by me and considered in my medical decision making (see chart for details).    MDM Rules/Calculators/A&P                          39 year old male presenting for evaluation of left lower extremity pain. Had ultrasound this morning which showed DVT in the left leg. On my assessment he is now complaining of shortness of breath for the last 3 days. Will obtain CTA chest and laboratory work.  CBC nonacute  BMP nonacute  LLE [place] study positive for DVT  CTA chest Normal exam. No evidence of pulmonary embolism. No acute intrathoracic abnormalities.  Patient given first dose of Xarelto here in the ED.  Contacted pharmacy who will come see the patient to give starter pack and discuss anticoagulation therapy.  2:55 PM CONSULT with Raquita with social work who will help patient obtain a PCP appointment.   Patient given information to follow-up with PCP.  Advised on strict return precautions.  He voices understanding plan reasons to return. all Questions answered.  Patient stable for discharge.  Final Clinical Impression(s) / ED Diagnoses Final diagnoses:  Acute deep vein  thrombosis (DVT) of left lower extremity, unspecified vein (HCC)    Rx / DC Orders ED Discharge Orders         Ordered    RIVAROXABAN (XARELTO) VTE STARTER PACK (15 & 20 MG TABLETS)     Discontinue  Reprint     05/03/20 1454    acetaminophen (TYLENOL) 325 MG tablet  Every 6 hours PRN     Discontinue  Reprint     05/03/20 [address] Rajah Tagliaferro S, PA-C 05/03/20 1525    Arby Barrette, MD 05/04/20 1016

## 2020-05-03 NOTE — Discharge Instructions (Addendum)
Take Xarelto as directed.   Please follow up with your primary care provider within 5-7 days for re-evaluation of your symptoms. If you do not have a primary care provider, information for a healthcare clinic has been provided for you to make arrangements for follow up care. Please return to the emergency department for any new or worsening symptoms.  If you have any chest pain, pain with breathing, shortness of breath or any new or worsening symptoms you need to return to the emergency department immediately  Information on my medicine - XARELTO (rivaroxaban)  WHY WAS XARELTO PRESCRIBED FOR YOU? Xarelto was prescribed to treat blood clots that may have been found in the veins of your legs (deep vein thrombosis) or in your lungs (pulmonary embolism) and to reduce the risk of them occurring again.  What do you need to know about Xarelto? The starting dose is one 15 mg tablet taken TWICE daily with food for the FIRST 21 DAYS then the dose is changed to one 20 mg tablet taken ONCE A DAY with your evening meal.  DO NOT stop taking Xarelto without talking to the health care provider who prescribed the medication.  Refill your prescription for 20 mg tablets before you run out.  After discharge, you should have regular check-up appointments with your healthcare provider that is prescribing your Xarelto.  In the future your dose may need to be changed if your kidney function changes by a significant amount.  What do you do if you miss a dose? If you are taking Xarelto TWICE DAILY and you miss a dose, take it as soon as you remember. You may take two 15 mg tablets (total 30 mg) at the same time then resume your regularly scheduled 15 mg twice daily the next day.  If you are taking Xarelto ONCE DAILY and you miss a dose, take it as soon as you remember on the same day then continue your regularly scheduled once daily regimen the next day. Do not take two doses of Xarelto at the same time.    Important Safety Information Xarelto is a blood thinner medicine that can cause bleeding. You should call your healthcare provider right away if you experience any of the following: ? Bleeding from an injury or your nose that does not stop. ? Unusual colored urine (red or dark brown) or unusual colored stools (red or black). ? Unusual bruising for unknown reasons. ? A serious fall or if you hit your head (even if there is no bleeding).  Some medicines may interact with Xarelto and might increase your risk of bleeding while on Xarelto. To help avoid this, consult your healthcare provider or pharmacist prior to using any new prescription or non-prescription medications, including herbals, vitamins, non-steroidal anti-inflammatory drugs (NSAIDs) and supplements.  This website has more information on Xarelto: VisitDestination.com.br.

## 2020-05-03 NOTE — Progress Notes (Signed)
2nd shift ED CSW received a handoff from the 1st shift [facility] ED CSW.    CSW went to assist pt with a PCP appt but fter reviewing chart CSW sees pt has D/C'd.  CSW will continue to follow for D/C needs.  Dorothe Pea. Dianca Owensby  MSW, LCSW, LCAS, CCS Transitions of Care Clinical Social Worker Care Coordination Department Ph: [phone]

## 2020-05-03 NOTE — ED Triage Notes (Signed)
Patient had a vascular US today and was positive for a DVT in the left calf.

## 2020-05-03 NOTE — Social Work (Signed)
TOC CSW spoke with pt about PCP.  Pt does not currently have a PCP.  Pt does not have anyone in find at current moment.    2nd shift CSW will continue to follow pt.  Sherice Ijames Tarpley-Carter, MSW, LCSW-A [facility] ED Transitions of Care Clinical Social Worker [facility] [phone]

## 2020-05-03 NOTE — ED Provider Notes (Signed)
[facility]-EMERGENCY DEPT Provider Note   CSN: [id number] Arrival date & time: 05/02/20  2310     History Chief Complaint  Patient presents with  . Leg Pain    Perry Kane is a 39 y.o. male with a history of polysubstance use disorder, heroin use disorder who presents the emergency department with a chief complaint of left calf pain.  The patient reports gradual onset of cramping left calf pain that has been constant since onset, but waxing and waning and intensity. He denies recent falls, injury, fever, chills, numbness, weakness, left knee or ankle pain, shortness of breath, chest pain, back pain, paresthesias.  No redness or warmth to the site.  Pain is worse with ambulation and improved with nonweightbearing.  No recent surgery or immobilization.  No long travel.  No personal or familial history of VTE.  No history of similar.  He has been taking Tylenol without improvement.  He has also been applying an Ace wrap to the leg and elevating it with some improvement in his pain.  No new exercises or activities.  He does have history of IV drug use, but states that he has not used and months.  No recent tattoos.  The history is provided by the patient. No language interpreter was used.       History reviewed. No pertinent past medical history.  There are no problems to display for this patient.   History reviewed. No pertinent surgical history.     No family history on file.  Social History   Tobacco Use  . Smoking status: Current Every Day Smoker    Packs/day: 1.00    Types: Cigarettes  . Smokeless tobacco: Never Used  Substance Use Topics  . Alcohol use: Yes    Comment: 6pk-1/5 daily   . Drug use: Yes    Types: Heroin, Marijuana, Opium    Comment: heroine    Home Medications Prior to Admission medications   Medication Sig Start Date End Date Taking? Authorizing Provider  acetaminophen (TYLENOL) 500 MG tablet Take 1,000 mg by mouth every 6  (six) hours as needed. For pain or headache    [provider]    Allergies    Latex and Nickel  Review of Systems   Review of Systems  Constitutional: Negative for appetite change and fever.  Respiratory: Negative for shortness of breath.   Cardiovascular: Negative for chest pain.  Gastrointestinal: Negative for abdominal pain.  Genitourinary: Negative for dysuria.  Musculoskeletal: Positive for gait problem and myalgias. Negative for arthralgias, back pain, neck pain and neck stiffness.  Skin: Negative for rash and wound.  Allergic/Immunologic: Negative for immunocompromised state.  Neurological: Negative for seizures, syncope, weakness, numbness and headaches.  Psychiatric/Behavioral: Negative for confusion.    Physical Exam Updated Vital Signs BP (!) 140/113 (BP Location: Left Arm)   Pulse 79   Temp 98.6 F (37 C) (Oral)   Resp 18   Ht 5\' 4"  (1.626 m)   Wt 71.8 kg   SpO2 99%   BMI 27.15 kg/m   Physical Exam Vitals and nursing note reviewed.  Constitutional:      Appearance: He is well-developed. He is not ill-appearing, toxic-appearing or diaphoretic.  HENT:     Head: Normocephalic.  Eyes:     Conjunctiva/sclera: Conjunctivae normal.  Cardiovascular:     Rate and Rhythm: Normal rate and regular rhythm.     Heart sounds: No murmur heard.   Pulmonary:     Effort:  Pulmonary effort is normal. No respiratory distress.     Breath sounds: No stridor. No wheezing, rhonchi or rales.  Chest:     Chest wall: No tenderness.  Abdominal:     Palpations: Abdomen is soft.     Tenderness: There is no abdominal tenderness.  Musculoskeletal:        General: Tenderness present.     Cervical back: Neck supple.     Right lower leg: No edema.     Left lower leg: No edema.     Comments: Tenderness to palpation in the calf.  Muscular compartments are soft.  Multiple tattoos are noted to the left lower leg, but there is no erythema, warmth, induration, or fluctuance.  No  track marks noted to the lower extremity.  Normal exam of the left knee without focal tenderness to the medial or lateral joint line.  Full active and passive range of motion.  Normal exam of the left ankle.  Achilles tendon is intact.  Good capillary refill.  Decreased strength against resistance secondary to pain.  Sensation is intact throughout.  Skin:    General: Skin is warm and dry.  Neurological:     Mental Status: He is alert.  Psychiatric:        Behavior: Behavior normal.     ED Results / Procedures / Treatments   Labs (all labs ordered are listed, but only abnormal results are displayed) Labs Reviewed - No data to display  EKG None  Radiology No results found.  Procedures Procedures (including critical care time)  Medications Ordered in ED Medications  ketorolac (TORADOL) 30 MG/ML injection 30 mg (30 mg Intramuscular Given 05/03/20 0329)  enoxaparin (LOVENOX) injection 110 mg (110 mg Subcutaneous Given 05/03/20 0330)    ED Course  I have reviewed the triage vital signs and the nursing notes.  Pertinent labs & imaging results that were available during my care of the patient were reviewed by me and considered in my medical decision making (see chart for details).    MDM Rules/Calculators/A&P                          39 year old male with a history of polysubstance use disorder, heroin use disorder presenting with left calf pain for the last 4 days.  No other associated symptoms including shortness of breath, or chest pain.  No history of VTE.  On exam, there is no evidence of compartment syndrome, gout, or infectious process.  He does have a history of IV drug use, but is adamant that he has not used any months.  Notably, he had a visit to the ER approximately 1 month ago for heroin ingestion.  He has had no recent injuries to suspect musculoskeletal strain or tear.  His Achilles tendon is intact.  He is having no other cramping or spasms to suggest rhabdomyolysis.  No  shortness of breath or chest pain suggestive of PE.  He will need a venous duplex of the left lower extremity to assess for DVT.  Pain controlled in the ER he was given a dose of Lovenox.  Counseled on return precautions for anticoagulation.  Outpatient venous duplex study has been placed.  He will be given crutches and a brace to help with pain control.  All questions answered.  He is hemodynamically stable and in no acute distress.  Safe for discharge at this time to follow-up with outpatient venous duplex study.  Final Clinical Impression(s) / ED  Diagnoses Final diagnoses:  Pain in left lower leg    Rx / DC Orders ED Discharge Orders         Ordered    LE VENOUS        05/03/20 0303           Frederik Pear A, PA-C 05/03/20 0932    Shon Baton, MD 05/03/20 [phone]

## 2020-06-05 ENCOUNTER — Emergency Department (HOSPITAL_COMMUNITY): Payer: Self-pay

## 2020-06-05 ENCOUNTER — Other Ambulatory Visit: Payer: Self-pay

## 2020-06-05 ENCOUNTER — Encounter (HOSPITAL_COMMUNITY): Payer: Self-pay | Admitting: Emergency Medicine

## 2020-06-05 ENCOUNTER — Emergency Department (HOSPITAL_COMMUNITY)
Admission: EM | Admit: 2020-06-05 | Discharge: 2020-06-06 | Disposition: A | Discharge status: Home / Self Care | Payer: Self-pay | Attending: Emergency Medicine | Admitting: Emergency Medicine

## 2020-06-05 DIAGNOSIS — Z7901 Long term (current) use of anticoagulants: Secondary | ICD-10-CM | POA: Insufficient documentation

## 2020-06-05 DIAGNOSIS — R000 Tachycardia, unspecified: Secondary | ICD-10-CM | POA: Insufficient documentation

## 2020-06-05 DIAGNOSIS — F17210 Nicotine dependence, cigarettes, uncomplicated: Secondary | ICD-10-CM | POA: Insufficient documentation

## 2020-06-05 DIAGNOSIS — R4182 Altered mental status, unspecified: Secondary | ICD-10-CM | POA: Insufficient documentation

## 2020-06-05 DIAGNOSIS — R4189 Other symptoms and signs involving cognitive functions and awareness: Secondary | ICD-10-CM

## 2020-06-05 DIAGNOSIS — Z20822 Contact with and (suspected) exposure to covid-19: Secondary | ICD-10-CM | POA: Insufficient documentation

## 2020-06-05 DIAGNOSIS — Z91040 Latex allergy status: Secondary | ICD-10-CM | POA: Insufficient documentation

## 2020-06-05 DIAGNOSIS — T401X1A Poisoning by heroin, accidental (unintentional), initial encounter: Secondary | ICD-10-CM | POA: Insufficient documentation

## 2020-06-05 HISTORY — DX: Other psychoactive substance abuse, uncomplicated: F19.10

## 2020-06-05 LAB — CBC WITH DIFFERENTIAL/PLATELET
Abs Immature Granulocytes: 0.23 10*3/uL — ABNORMAL HIGH (ref 0.00–0.07)
Basophils Absolute: 0.1 10*3/uL (ref 0.0–0.1)
Basophils Relative: 0 %
Eosinophils Absolute: 0 10*3/uL (ref 0.0–0.5)
Eosinophils Relative: 0 %
HCT: 41 % (ref 39.0–52.0)
Hemoglobin: 13 g/dL (ref 13.0–17.0)
Immature Granulocytes: 1 %
Lymphocytes Relative: 5 %
Lymphs Abs: 1.1 10*3/uL (ref 0.7–4.0)
MCH: 30.5 pg (ref 26.0–34.0)
MCHC: 31.7 g/dL (ref 30.0–36.0)
MCV: 96.2 fL (ref 80.0–100.0)
Monocytes Absolute: 1.2 10*3/uL — ABNORMAL HIGH (ref 0.1–1.0)
Monocytes Relative: 5 %
Neutro Abs: 19.9 10*3/uL — ABNORMAL HIGH (ref 1.7–7.7)
Neutrophils Relative %: 89 %
Platelets: 277 10*3/uL (ref 150–400)
RBC: 4.26 MIL/uL (ref 4.22–5.81)
RDW: 12.5 % (ref 11.5–15.5)
WBC: 22.6 10*3/uL — ABNORMAL HIGH (ref 4.0–10.5)
nRBC: 0 % (ref 0.0–0.2)

## 2020-06-05 LAB — RAPID URINE DRUG SCREEN, HOSP PERFORMED
Amphetamines: POSITIVE — AB
Barbiturates: NOT DETECTED
Benzodiazepines: NOT DETECTED
Cocaine: NOT DETECTED
Opiates: NOT DETECTED
Tetrahydrocannabinol: POSITIVE — AB

## 2020-06-05 LAB — SARS CORONAVIRUS 2 BY RT PCR (HOSPITAL ORDER, PERFORMED IN ~~LOC~~ HOSPITAL LAB): SARS Coronavirus 2: NEGATIVE

## 2020-06-05 LAB — URINALYSIS, ROUTINE W REFLEX MICROSCOPIC
Bilirubin Urine: NEGATIVE
Glucose, UA: >=500 mg/dL — AB
Hgb urine dipstick: NEGATIVE
Ketones, ur: NEGATIVE mg/dL
Nitrite: NEGATIVE
Protein, ur: 30 mg/dL — AB
Specific Gravity, Urine: 1.017 (ref 1.005–1.030)
pH: 5 (ref 5.0–8.0)

## 2020-06-05 LAB — COMPREHENSIVE METABOLIC PANEL
ALT: 20 U/L (ref 0–44)
AST: 24 U/L (ref 15–41)
Albumin: 3.4 g/dL — ABNORMAL LOW (ref 3.5–5.0)
Alkaline Phosphatase: 61 U/L (ref 38–126)
Anion gap: 8 (ref 5–15)
BUN: 13 mg/dL (ref 6–20)
CO2: 27 mmol/L (ref 22–32)
Calcium: 8.2 mg/dL — ABNORMAL LOW (ref 8.9–10.3)
Chloride: 103 mmol/L (ref 98–111)
Creatinine, Ser: 1.03 mg/dL (ref 0.61–1.24)
GFR calc Af Amer: >60 mL/min (ref >60)
GFR calc non Af Amer: >60 mL/min (ref >60)
Glucose, Bld: 197 mg/dL — ABNORMAL HIGH (ref 70–99)
Potassium: 4.4 mmol/L (ref 3.5–5.1)
Sodium: 138 mmol/L (ref 135–145)
Total Bilirubin: 0.4 mg/dL (ref 0.3–1.2)
Total Protein: 7 g/dL (ref 6.5–8.1)

## 2020-06-05 LAB — ETHANOL: Alcohol, Ethyl (B): <10 mg/dL (ref <10)

## 2020-06-05 LAB — ACETAMINOPHEN LEVEL: Acetaminophen (Tylenol), Serum: <10 ug/mL — ABNORMAL LOW (ref 10–30)

## 2020-06-05 LAB — SALICYLATE LEVEL: Salicylate Lvl: <7 mg/dL — ABNORMAL LOW (ref 7.0–30.0)

## 2020-06-05 MED ORDER — SODIUM CHLORIDE 0.9 % IV BOLUS
1000.0000 mL | Freq: Once | INTRAVENOUS | Status: AC
  Administered 2020-06-05: 1000 mL via INTRAVENOUS

## 2020-06-05 MED ORDER — ONDANSETRON HCL 4 MG/2ML IJ SOLN
4.0000 mg | Freq: Once | INTRAMUSCULAR | Status: AC
  Administered 2020-06-05: 4 mg via INTRAVENOUS
  Filled 2020-06-05: qty 2

## 2020-06-05 MED ORDER — RIVAROXABAN (XARELTO) VTE STARTER PACK (15 & 20 MG): 20.0000 mg | ORAL_TABLET | Freq: Every day | ORAL | Status: DC

## 2020-06-05 MED ORDER — RIVAROXABAN 20 MG PO TABS
20.0000 mg | ORAL_TABLET | Freq: Every day | ORAL | Status: DC
  Administered 2020-06-05: 20 mg via ORAL
  Filled 2020-06-05: qty 1

## 2020-06-05 NOTE — ED Notes (Signed)
Unable to find CO2 end-tidal machine, none available in the department. PA notified.

## 2020-06-05 NOTE — ED Provider Notes (Addendum)
1600: Patient signed out to me by previous EDPA at shift change pending metabolization.  See previous note for full details.  Briefly, patient admitted to snorting heroin and smoking marijuana.  Denies suicide attempt.  Found on the ground by father at home.  Labs reviewed reveal leukocytosis 22.6, hyperglycemia 197.    1600: I messaged RN [place] and asked for patient to be put on end tidal but states she cannot find the machine.   Physical Exam  BP 118/79   Pulse 73   Temp 97.6 F (36.4 C) (Oral)   Resp 13   Ht 5\' 4"  (1.626 m)   Wt 72 kg   SpO2 94%   BMI 27.25 kg/m   Physical Exam Vitals and nursing note reviewed.  Constitutional:      General: He is not in acute distress.    Appearance: He is well-developed.     Comments: NAD.  HENT:     Head: Normocephalic.     Right Ear: External ear normal.     Left Ear: External ear normal.     Nose: Nose normal.     Mouth/Throat:     Mouth: Mucous membranes are dry.     Comments: Dry lips, sticking together. No intraoral tongue or lip injury or bleed Eyes:     General: No scleral icterus.    Conjunctiva/sclera: Conjunctivae normal.  Cardiovascular:     Rate and Rhythm: Normal rate and regular rhythm.     Heart sounds: Normal heart sounds.     Comments: No LE edema, calf tenderness  Pulmonary:     Effort: Pulmonary effort is normal.     Breath sounds: Normal breath sounds.  Abdominal:     Palpations: Abdomen is soft.     Tenderness: There is no abdominal tenderness.  Musculoskeletal:        General: Normal range of motion.     Cervical back: Normal range of motion and neck supple.  Skin:    General: Skin is warm and dry.     Capillary Refill: Capillary refill takes less than 2 seconds.  Neurological:     Mental Status: He is alert and oriented to person, place, and time.     Comments: Asleep in hall bed but easily arousable to voice.  Can answer appropriately intermittently falls asleep during conversation.  Chooses to  keep eyes closed during conversation. Oriented to full name, city, year, events.  Strength and sensation intact in upper/lower extremities.    Psychiatric:        Behavior: Behavior normal.        Thought Content: Thought content normal.        Judgment: Judgment normal.     Comments: Denies self harm attempt, SI, HI, hallucinations.      ED Course/Procedures   Clinical Course as of Jun 05 2028  Tue Jun 05, 2020  1359 EKG: Rate 101 Sinus tachycardia Normal axis Normal intervals Normal ST/T waves   [CS]  1846 Glucose, UA(!): >=500 [CG]  1846 Non acute   DG Chest 1 View [CG]  1847 negative  CT Head Wo Contrast [CG]  1847 WBC(!): 22.6 [CG]  1847 Temp: 97.6 F (36.4 C) [CG]  1847 Pulse Rate: 97 [CG]  1847 Resp: 20 [CG]  1847 SpO2: 94 % [CG]  1847 Amphetamines(!): POSITIVE [CG]  1847 Tetrahydrocannabinol(!): POSITIVE [CG]  1847 Leukocytes,Ua(!): TRACE [CG]  1847 WBC, UA: 21-50 [CG]  1847 Bacteria, UA(!): RARE [CG]    Clinical Course User  Index [CG] Liberty Handy, PA-C [CS] [name], MD    Procedures  MDM   [phone]: I have reviewed patient's chart.   Seen last month and diagnosed with DVT.  On Xarelto. Patient states he has been taking it but ran out 1 week ago. RLE swelling and calf pain resolved. Denies chest pain, SOB, hemoptysis.  No symptoms to suggest PE. VS normal. No tachychardia, tachypnea, hypoxia.   Given found on ground, I added head CT, EKG.    Leukocytosis WBC 22 with left shift.   Patient states he used heroin because he was having withdrawals, states he has been on suboxone last dose 4 days ago but since has had nausea, diarrhea for the last few days without abdominal pain.  Feels "dehydrated".  Denies fever, vomiting. Denies cough. Denies dysuria.  Leukocytosis may be from dehydration but given unreliable history I added CXR, UA, COVID swab.  1 L IVF ordered.  1850: CXR without infectious findings. UA as above, but again pt without  symptoms, suprapubic or CVAT.  Will send for culture and defer treatment for UTI.  COVID swab pending. However, no hypoxia or respiratory symptoms.    Patient is alert now, holding conversation.  Medically cleared.    Patient's father filled out IVC paperwork. Per IVC paperwork patient has been reporting hearing voices to father, non compliant with medication for blood clot, continues to use drugs, aggressive and threatening.  Patient denies these to me.  No longer tachycardic. Denies nausea.  Does not appear to be in acute severe withdrawal.   Pending TTS   2030: Patient re-evaluated, now on end tidal. Reports nausea but wants to eat.  Will give zofran, PO challenge. Father now at bedside.  Awaiting TTS. Patient agreeable to psychiatric eval.    Liberty Handy, PA-C 06/05/20 2029    Rolan Bucco, MD 06/05/20 2150

## 2020-06-05 NOTE — ED Notes (Signed)
End-tidal machine found, not able to monitor end-tidal CO2 in the hallway, machines do not have the capability.

## 2020-06-05 NOTE — ED Triage Notes (Addendum)
Arrives via EMS from home, dad found patient unconscious on the floor, patient is supposed to be on suboxone, has not been getting it. Snorted heroin, unknown time. Got 2 of Narcan, 1 nasal, 1 IV.  Awake and alert but sleepy at this time.

## 2020-06-05 NOTE — Progress Notes (Signed)
CSW spoke to EDP who states pt is to see TTS.  2nd shift ED CSW will leave handoff for 1st shift ED CSW.  CSW will continue to follow for D/C needs.  Dorothe Pea. Chirag Krueger  MSW, LCSW, LCAS, CCS Transitions of Care Clinical Social Worker Care Coordination Department Ph: [phone]

## 2020-06-05 NOTE — ED Notes (Signed)
Security wanded the patient with a Printmaker.

## 2020-06-05 NOTE — ED Provider Notes (Signed)
[facility]-EMERGENCY DEPT Provider Note   CSN: [id number] Arrival date & time: 06/05/20  1301     History Chief Complaint  Patient presents with  . Drug Overdose    KWINTON MAAHS is a 39 y.o. male w PMHx polysubstance abuse, presenting to the ED via EMS from home.  Patient was found unconscious by his father on the floor.  He states he snorted heroin.  He was given a total of 2 mg of Narcan, 1 dose was intranasal and 1 dose with IV.  He arrived to the ED sleepy but awake.  CBG by EMS was in the 300s.  He states he was planning to start Suboxone on Monday.  He feels dehydrated.  He also endorses marijuana use.  History is limited due to patient's drowsiness and intoxication.  The history is provided by the EMS personnel and the patient. The history is limited by the condition of the patient.       Past Medical History:  Diagnosis Date  . Polysubstance abuse (HCC)     There are no problems to display for this patient.   Past Surgical History:  Procedure Laterality Date  . FRACTURE SURGERY         Family History  Problem Relation Age of Onset  . Heart attack Mother   . Heart failure Father     Social History   Tobacco Use  . Smoking status: Current Every Day Smoker    Packs/day: 1.00    Types: Cigarettes  . Smokeless tobacco: Never Used  Vaping Use  . Vaping Use: Never used  Substance Use Topics  . Alcohol use: Yes  . Drug use: Yes    Types: Heroin, Marijuana, Opium    Comment: heroin    Home Medications Prior to Admission medications   Medication Sig Start Date End Date Taking? Authorizing Provider  acetaminophen (TYLENOL) 325 MG tablet Take 2 tablets (650 mg total) by mouth every 6 (six) hours as needed. Do not take more than 4000mg  of tylenol per day 05/03/20   Couture, Cortni S, PA-C  RIVAROXABAN (XARELTO) VTE STARTER PACK (15 & 20 MG TABLETS) Follow package directions: Take one 15mg  tablet by mouth twice a day. On day 22, switch to  one 20mg  tablet once a day. Take with food. 05/03/20   Couture, Cortni S, PA-C    Allergies    Latex and Nickel  Review of Systems   Review of Systems  Unable to perform ROS: Mental status change    Physical Exam Updated Vital Signs BP 113/77 (BP Location: Left Arm)   Pulse 97   Temp 97.6 F (36.4 C) (Oral)   Resp 20   Ht 5\' 4"  (1.626 m)   Wt 72 kg   SpO2 94%   BMI 27.25 kg/m   Physical Exam Vitals and nursing note reviewed.  Constitutional:      Appearance: He is well-developed.     Comments: Drowsy but awake.  HENT:     Head: Normocephalic and atraumatic.  Eyes:     Conjunctiva/sclera: Conjunctivae normal.  Cardiovascular:     Rate and Rhythm: Regular rhythm. Tachycardia present.     Comments: Slightly tachycardic Pulmonary:     Effort: Pulmonary effort is normal. No respiratory distress.     Breath sounds: Normal breath sounds.  Abdominal:     General: Bowel sounds are normal.     Palpations: Abdomen is soft.     Tenderness: There is no abdominal tenderness.  Skin:    General: Skin is warm.  Neurological:     Comments: Spontaneously moving all extremities  Psychiatric:        Behavior: Behavior normal.     ED Results / Procedures / Treatments   Labs (all labs ordered are listed, but only abnormal results are displayed) Labs Reviewed  COMPREHENSIVE METABOLIC PANEL - Abnormal; Notable for the following components:      Result Value   Glucose, Bld 197 (*)    Calcium 8.2 (*)    Albumin 3.4 (*)    All other components within normal limits  CBC WITH DIFFERENTIAL/PLATELET - Abnormal; Notable for the following components:   WBC 22.6 (*)    Neutro Abs 19.9 (*)    Monocytes Absolute 1.2 (*)    Abs Immature Granulocytes 0.23 (*)    All other components within normal limits  ACETAMINOPHEN LEVEL - Abnormal; Notable for the following components:   Acetaminophen (Tylenol), Serum <10 (*)    All other components within normal limits  SALICYLATE LEVEL - Abnormal;  Notable for the following components:   Salicylate Lvl <7.0 (*)    All other components within normal limits  SARS CORONAVIRUS 2 BY RT PCR (HOSPITAL ORDER, PERFORMED IN [facility])  ETHANOL  RAPID URINE DRUG SCREEN, HOSP PERFORMED  URINALYSIS, ROUTINE W REFLEX MICROSCOPIC    EKG None  Radiology No results found.  Procedures Procedures (including critical care time)  Medications Ordered in ED Medications - No data to display  ED Course  I have reviewed the triage vital signs and the nursing notes.  Pertinent labs & imaging results that were available during my care of the patient were reviewed by me and considered in my medical decision making (see chart for details).  Clinical Course as of Jun 05 1513  Tue Jun 05, 2020  1359 EKG: Rate 101 Sinus tachycardia Normal axis Normal intervals Normal ST/T waves   [CS]    Clinical Course User Index [CS] [name], MD   MDM Rules/Calculators/A&P                          Pt presenting via EMS after accidental OD on heroin, he reports he snorted it and also used marijuana. A total of 2mg  of narcan was administered. He arrives drowsy but awake and conversant. No resp distress, maintaining his airway. Slightly tachycardic, though otherwise. VSS.  Labs obtained, hyperglycemia, normal gap and bicarb. Leukocytosis is present, unclear etiology. Pt will need re-evaluation once more alert. Care assumed at shift change by PA Gibbons.  Final Clinical Impression(s) / ED Diagnoses Final diagnoses:  Accidental overdose of heroin, initial encounter [facility])    Rx / DC Orders ED Discharge Orders    None       Fed Ceci, [facility], PA-C 06/05/20 1514    08/05/20, MD 06/05/20 1537

## 2020-06-05 NOTE — ED Notes (Signed)
Patient dressed out into purple scrubs and two bags of patient belongings taken and secured behind nurses station.

## 2020-06-06 NOTE — Discharge Instructions (Addendum)
1.  It is very important that you go to your 10 AM appointment today at [facility]. 2.  A resource guide for low cost medical care has been added to your discharge instructions. 3.  Return to the emergency department if you have thoughts of hurting or killing yourself, injuring or killing others, concerning medical symptoms. 4.  Make an appointment to see a medical provider as soon as possible.  If you do not have one, there is a referral number in the discharge instructions he can call to help find a provider also I have included the resource guide for low-cost medical care in your discharge instructions.

## 2020-06-06 NOTE — ED Notes (Signed)
TTS at bedside. 

## 2020-06-06 NOTE — ED Notes (Signed)
Patient called out to desk requesting belongings to go home. Patient was informed that he was IVC and could not leave. Patient became agitated and security was contacted. Order for safety sitter placed and [facility] RN contacted.
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
## 2020-06-06 NOTE — ED Notes (Signed)
Safety sitter at bedside 

## 2020-06-06 NOTE — Progress Notes (Signed)
Reassessment: Patient seen via telepsych. Chart reviewed. Patient is a 39 year old male with history of polysubstance abuse, who presented to ED via EMS after overdosing on heroin. IVC paperwork was initiated by his father as described below.  Patient is calm and cooperative on assessment. He reports taking suboxone for years through the prison system. He was recently released from prison. He ran out of the suboxone several days ago and snorted heroin in an effort to control the withdrawal symptoms. He states he had only used heroin a few times before and not recently. He strongly denies any suicidal intent and states the overdose was accidental.   He denies any SI/HI/AVH. 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 He shows no signs of responding to internal stimuli. Thoughts are well-organized. I asked about father's report of "threatening behavior" on IVC paperwork. Patient does admit that he and his father have been arguing recently since he was released from prison, and admits he has an "aggressive demeanor" due to having to defend himself when he was in prison. He strongly denies any HI or thoughts of hurting anyone.  Patient has an appointment at 10am at [facility] this morning to start treatment for his substance use disorder and is requesting discharge so that he can attend this appointment. I reinforced risk of overdose with heroin/opioid use and need to avoid drug use due to potential for overdose and death, and patient states understanding. He is expressing motivation for treatment through [facility].  I attempted to call patient's father/IVC petitioner Arius Harnois [phone]) with HIPAA-compliant voicemail left.  Per TTS assessment: Patient's father filled out IVC paperwork. Per IVC paperwork patient has been reporting hearing voices to father, non compliant with medication for blood clot, continues to use drugs, aggressive and threatening. Patient  denies these to me. 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  He said that he has not gotten it refilled due to not having a primary care doctor and no insurance. Patient denies any SI, intention or plan.  He denies any past suicide attempts.  Patient denies any HI or A/V hallucinations.  Patient reports that he does use suboxone that he gets off the street.  He says he got started on it when he was in prison.  He has been getting between 8-16 grams per day up to last Thursday (07/29).  He says he started having withdrawal symptoms and yesterday (08/03) snorted some heroin to alleviate his withdrawal symptoms.  He was found unconscious by his father yesterday.  Father called EMS and did chest compressions until EMS arrived.    Patient has been using marijuana and methamphetamine.  Last use of marijuana was a week ago and last use of meth was 3 days ago.  Patient says that he has intake appointments today at [facility] in [place].  Pt is worried about missing these appointments and having a set back in getting treatment.  He is very concerned about this and expressed some frustration about having to be at the [facility] this morning until psychiatry could see him.    Patient is on probation and has pending charges which include possession of methamphetamine, paraphenalia, driving w/o a license.    Patient's father was contacted.  He said that patient has been threatening at times.  He said that the hearing voices has been in the past.  When questioned on this he says it has not been lately.  Pt he said had the opportunity to get the xarelto refilled but did not do it.  He then said that patient did not have a primary care doctor.    Disposition: Patient poses no acute risk of harm to self or others and is requesting discharge. He does not meet criteria for inpatient psychiatric hospitalization and is psych cleared for discharge. EDP and ED RN updated.

## 2020-06-06 NOTE — BH Assessment (Signed)
[facility] Assessment Progress Note  Per Marciano Sequin, NP, this pt does not require psychiatric hospitalization at this time.  Pt presents under IVC initiated by pt's father and rescinded by EDP Arby Barrette, MD.  Pt was discharged before behavioral health referrals could be entered into pt's discharge instructions.  Pt's nurse has been notified.  Doylene Canning, MA Triage Specialist [phone]

## 2020-06-06 NOTE — ED Notes (Signed)
2 pt belonging bags returned to pt.

## 2020-06-06 NOTE — ED Notes (Signed)
Patient assisted to bathroom by sitter. Once pt back in room, he becomes very agitated stating he was going to leave one way or another. Pt heard in hallway yelling. Security called and in hallway for safety measures.

## 2020-06-06 NOTE — ED Provider Notes (Signed)
Emergency Medicine Observation Re-evaluation Note  Perry Kane is a 39 y.o. male, seen on rounds today.  Pt initially presented to the ED for complaints of Drug Overdose Currently, the patient is cleared for discharge by psychiatry.  Patient denies any suicidal ideation or homicidal ideation.  He advises he has follow-up appointment today.  Physical Exam  BP 121/71 (BP Location: Right Arm)   Pulse 75   Temp 98.3 F (36.8 C) (Oral)   Resp 16   Ht 5\' 4"  (1.626 m)   Wt 72 kg   SpO2 96%   BMI 27.25 kg/m  Physical Exam Patient is alert and well in appearance.  Mental status is clear.  Eating breakfast at this time.  All movements coordinated purposeful symmetric. ED Course / MDM  EKG:EKG Interpretation  Date/Time:  Tuesday June 05 2020 13:12:24 EDT Ventricular Rate:  101 PR Interval:  156 QRS Duration: 92 QT Interval:  348 QTC Calculation: 451 R Axis:   78 Text Interpretation: Sinus tachycardia Otherwise normal ECG since last tracing no significant change Confirmed by [id number] [phone]) on 06/05/2020 5:19:41 PM  Clinical Course as of Jun 07 827  Tue Jun 05, 2020  1359 EKG: Rate 101 Sinus tachycardia Normal axis Normal intervals Normal ST/T waves   [CS]  1846 Glucose, UA(!): >=500 [CG]  1846 Non acute   DG Chest 1 View [CG]  1847 negative  CT Head Wo Contrast [CG]  1847 WBC(!): 22.6 [CG]  1847 Temp: 97.6 F (36.4 C) [CG]  1847 Pulse Rate: 97 [CG]  1847 Resp: 20 [CG]  1847 SpO2: 94 % [CG]  1847 Amphetamines(!): POSITIVE [CG]  1847 Tetrahydrocannabinol(!): POSITIVE [CG]  1847 Leukocytes,Ua(!): TRACE [CG]  1847 WBC, UA: 21-50 [CG]  1847 Bacteria, UA(!): RARE [CG]    Clinical Course User Index [CG] Jun 07, 2020, PA-C [CS] Perry Handy, MD   I have reviewed the labs performed to date as well as medications administered while in observation.  Recent changes in the last 24 hours include cleared by psychiatry. Plan  Current plan is for  discharge. Patient is not under full IVC at this time.   Perry Savoy, MD 06/06/20 0830

## 2020-06-07 LAB — URINE CULTURE

## 2020-11-26 IMAGING — CT CT ANGIO CHEST
2 of 6 series · 18 of 36 positions shown · IV contrast (OMNIPAQUE 350)
Comparison: None

CLINICAL DATA: DVT LEFT calf, LEFT leg pain, shortness of breath,
question pulmonary embolism, high clinical probability

EXAM:
CT ANGIOGRAPHY CHEST WITH CONTRAST
TECHNIQUE: Multidetector CT imaging of the chest was performed using the
standard protocol during bolus administration of intravenous
contrast. Multiplanar CT image reconstructions and MIPs were
obtained to evaluate the vascular anatomy.
CONTRAST:  100mL OMNIPAQUE IOHEXOL 350 MG/ML SOLN IV

[Series 5: thins · axial · 0.70mm/px · z∈[+1313,+1564]mm · 17 of 283 slices shown]
[im 16/283  lung]
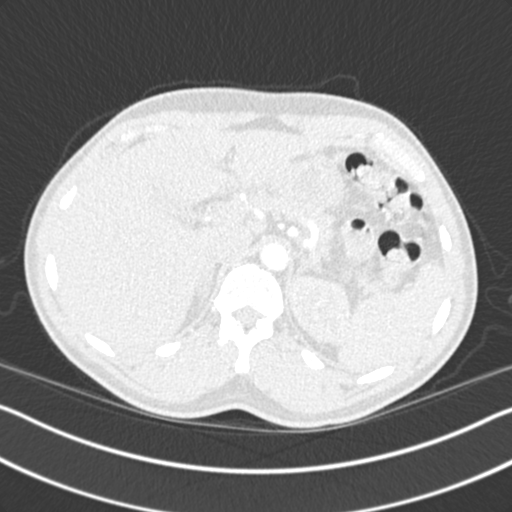
[im 32/283  mediastinal]
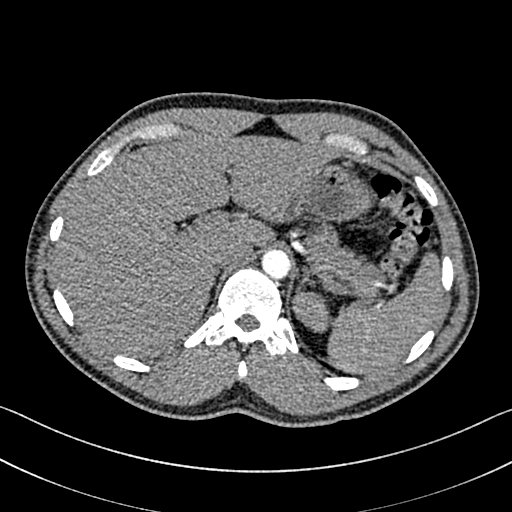
[im 48/283  lung]
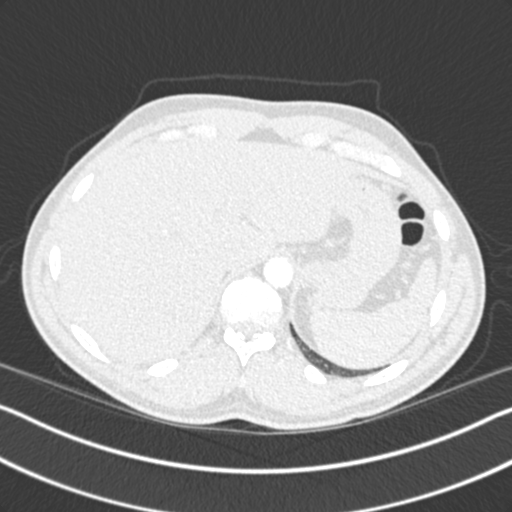
[im 63/283  mediastinal]
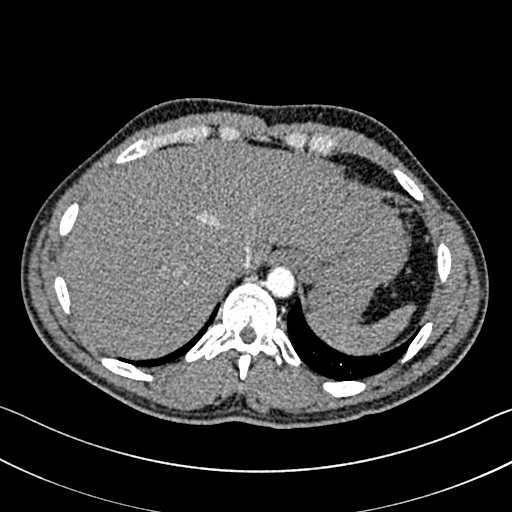
[im 79/283  lung]
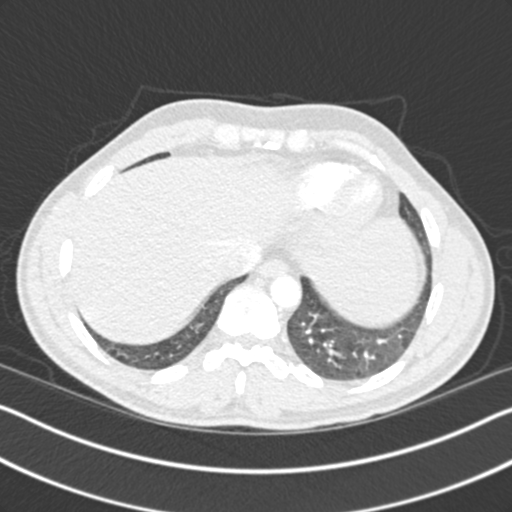
[im 95/283  mediastinal]
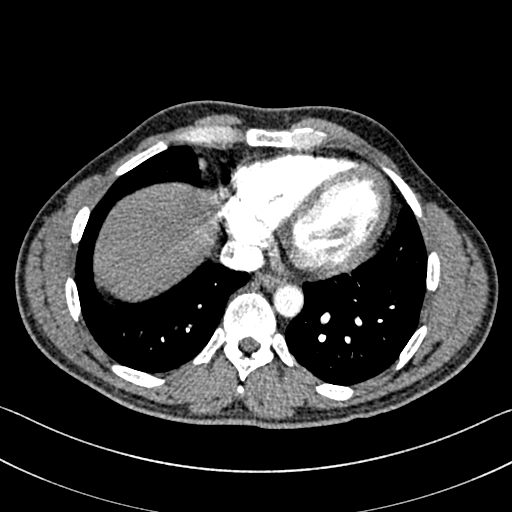
[im 110/283  lung]
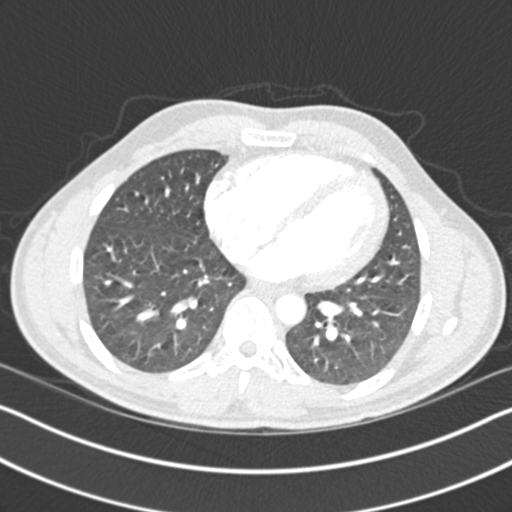
[im 126/283  mediastinal]
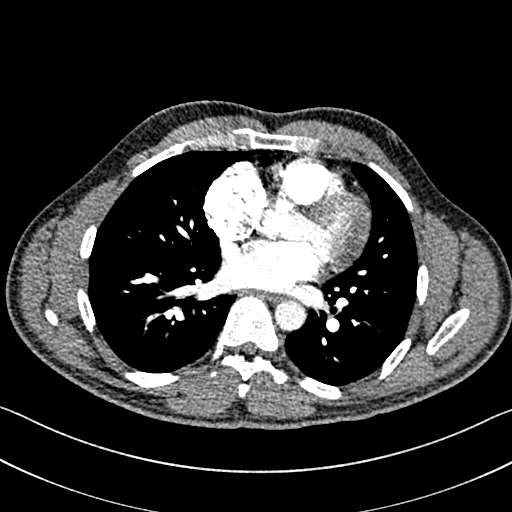
[im 142/283  lung]
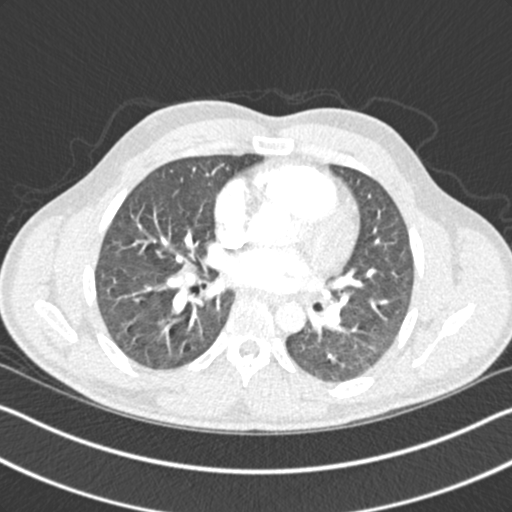
[im 157/283  mediastinal]
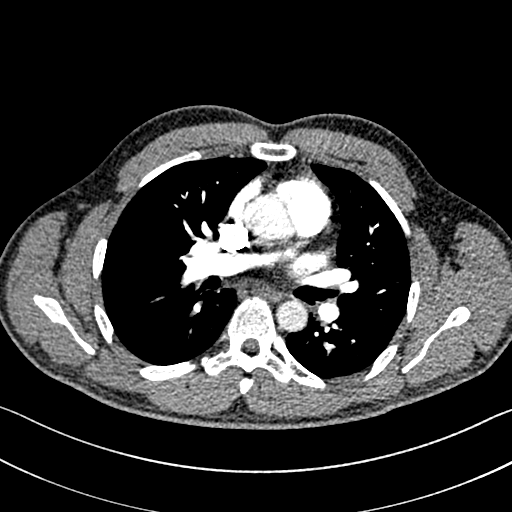
[im 173/283  lung]
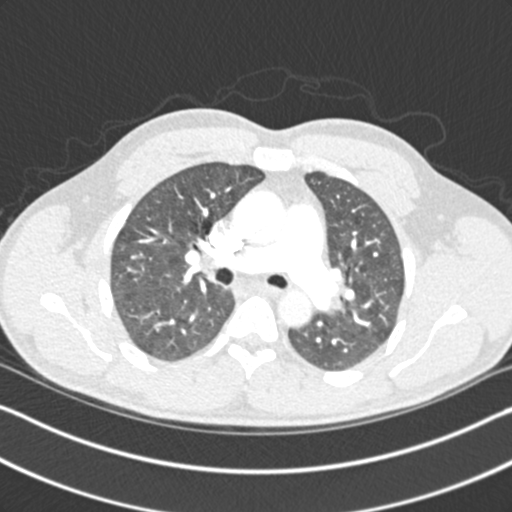
[im 189/283  mediastinal]
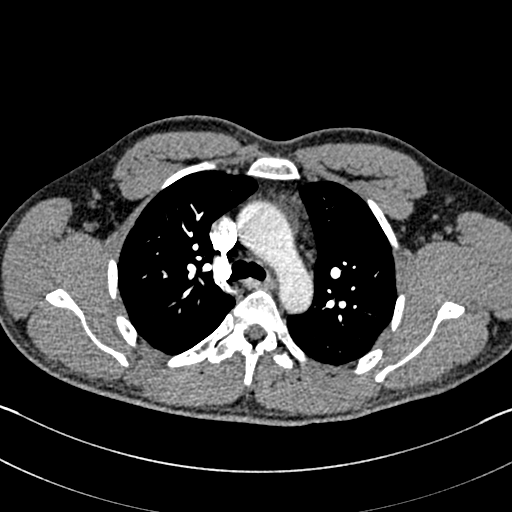
[im 204/283  lung]
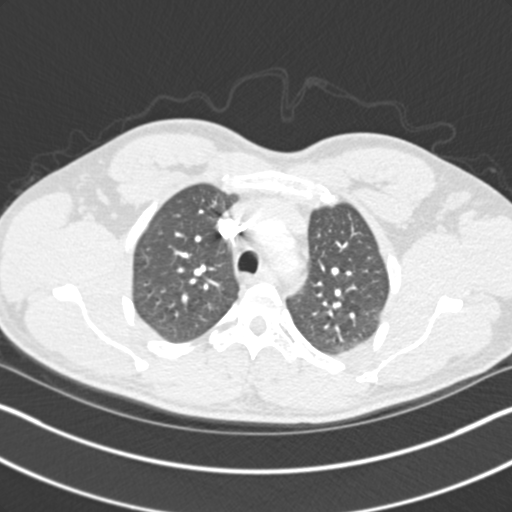
[im 220/283  mediastinal]
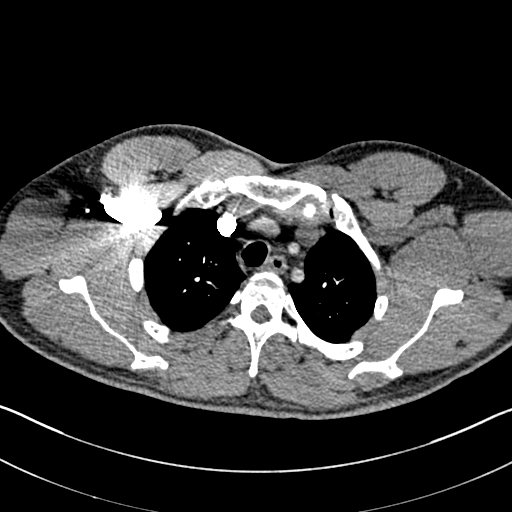
[im 236/283  lung]
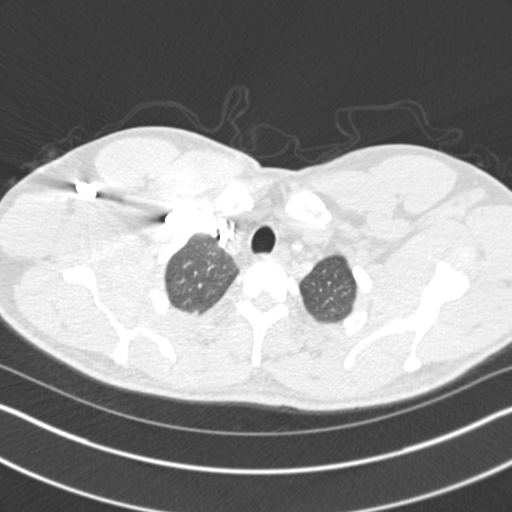
[im 251/283  mediastinal]
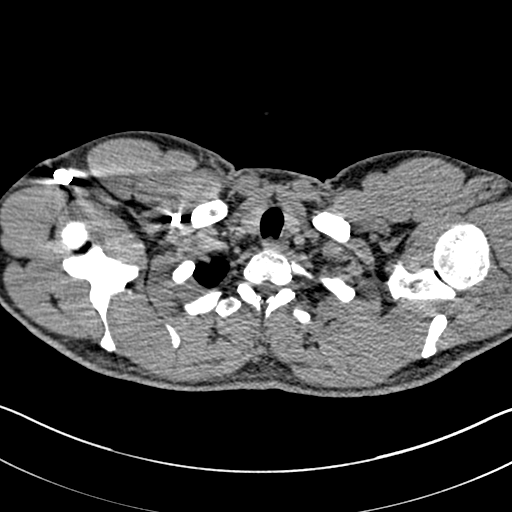
[im 267/283  lung]
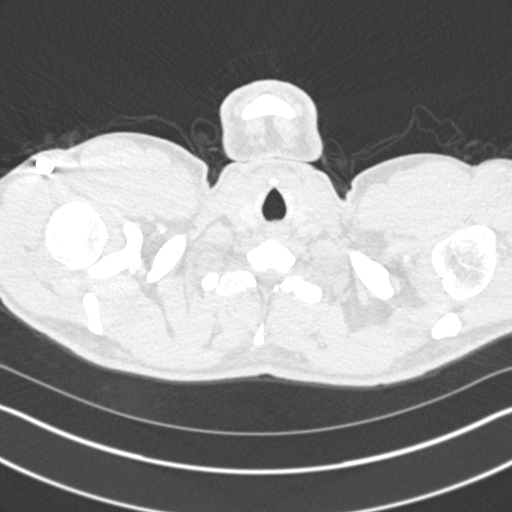

[Series 7: coronal mpr · coronal · 0.57mm/px · 1 of 148 slices shown]
[im 74/148  mediastinal]
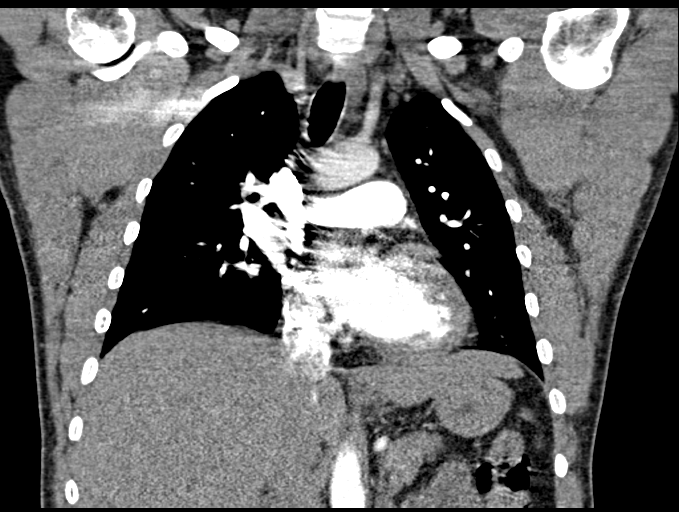

[18 of 36 positions shown; findings below may reference images not displayed]

FINDINGS: Cardiovascular: Aorta normal caliber without aneurysm or dissection.
Heart unremarkable. No pericardial effusion. Pulmonary arteries
adequately opacified and patent. No evidence of pulmonary embolism.

Mediastinum/Nodes: Esophagus normal appearance. No thoracic
adenopathy. Base of cervical region unremarkable.

Lungs/Pleura: Lungs clear. No pulmonary infiltrate, pleural effusion
or pneumothorax. No definite pulmonary mass.

Upper Abdomen: Visualized upper abdomen unremarkable

Musculoskeletal: No acute osseous findings.

Review of the MIP images confirms the above findings.
IMPRESSION: Normal exam.

No evidence of pulmonary embolism.

No acute intrathoracic abnormalities.

## 2025-01-18 ENCOUNTER — Ambulatory Visit: Admitting: Nurse Practitioner
